# Patient Record
Sex: Female | Born: 1990 | Race: Black or African American | Hispanic: No | Marital: Single | Smoking: Current every day smoker
Health system: Southern US, Community
[De-identification: ages and names within clinical notes are randomized; demographics above are authoritative.]

## PROBLEM LIST (undated history)

## (undated) DIAGNOSIS — Z8719 Personal history of other diseases of the digestive system: Secondary | ICD-10-CM

## (undated) DIAGNOSIS — J45909 Unspecified asthma, uncomplicated: Secondary | ICD-10-CM

## (undated) DIAGNOSIS — F329 Major depressive disorder, single episode, unspecified: Secondary | ICD-10-CM

## (undated) DIAGNOSIS — F419 Anxiety disorder, unspecified: Secondary | ICD-10-CM

## (undated) DIAGNOSIS — K219 Gastro-esophageal reflux disease without esophagitis: Secondary | ICD-10-CM

## (undated) DIAGNOSIS — D649 Anemia, unspecified: Secondary | ICD-10-CM

## (undated) DIAGNOSIS — M722 Plantar fascial fibromatosis: Secondary | ICD-10-CM

## (undated) DIAGNOSIS — Z9889 Other specified postprocedural states: Secondary | ICD-10-CM

## (undated) DIAGNOSIS — F32A Depression, unspecified: Secondary | ICD-10-CM

## (undated) HISTORY — PX: WISDOM TOOTH EXTRACTION: SHX21

## (undated) HISTORY — PX: MOUTH SURGERY: SHX715

## (undated) HISTORY — PX: ADENOIDECTOMY: SHX5191

## (undated) HISTORY — PX: HERNIA REPAIR: SHX51

## (undated) HISTORY — PX: TYMPANOSTOMY TUBE PLACEMENT: SHX32

---
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
## 2010-08-15 ENCOUNTER — Emergency Department: Payer: Self-pay | Admitting: Emergency Medicine

## 2010-08-18 ENCOUNTER — Emergency Department: Payer: Self-pay | Admitting: Emergency Medicine

## 2010-08-20 ENCOUNTER — Emergency Department: Payer: Self-pay

## 2010-10-23 ENCOUNTER — Emergency Department: Payer: Self-pay | Admitting: Emergency Medicine

## 2010-10-26 ENCOUNTER — Inpatient Hospital Stay (HOSPITAL_COMMUNITY)
Admission: AD | Admit: 2010-10-26 | Discharge: 2010-10-26 | Payer: Self-pay | Source: Home / Self Care | Attending: Obstetrics and Gynecology | Admitting: Obstetrics and Gynecology

## 2010-10-26 LAB — WET PREP, GENITAL: Yeast Wet Prep HPF POC: NONE SEEN

## 2010-10-28 LAB — GC/CHLAMYDIA PROBE AMP, GENITAL: GC Probe Amp, Genital: NEGATIVE

## 2011-05-04 ENCOUNTER — Emergency Department: Payer: Self-pay | Admitting: Emergency Medicine

## 2011-05-30 ENCOUNTER — Emergency Department: Payer: Self-pay | Admitting: Internal Medicine

## 2011-07-28 ENCOUNTER — Emergency Department: Payer: Self-pay | Admitting: Unknown Physician Specialty

## 2011-09-04 ENCOUNTER — Emergency Department: Payer: Self-pay | Admitting: Emergency Medicine

## 2012-03-22 ENCOUNTER — Observation Stay: Payer: Self-pay

## 2012-03-31 ENCOUNTER — Observation Stay: Payer: Self-pay | Admitting: Obstetrics and Gynecology

## 2012-04-15 ENCOUNTER — Observation Stay: Payer: Self-pay

## 2012-04-16 ENCOUNTER — Observation Stay: Payer: Self-pay

## 2012-04-16 LAB — PIH PROFILE
Anion Gap: 10 (ref 7–16)
BUN: 11 mg/dL (ref 7–18)
Creatinine: 0.82 mg/dL (ref 0.60–1.30)
EGFR (African American): >60
EGFR (Non-African Amer.): >60
Glucose: 111 mg/dL — ABNORMAL HIGH (ref 65–99)
HGB: 11.6 g/dL — ABNORMAL LOW (ref 12.0–16.0)
MCH: 27.4 pg (ref 26.0–34.0)
MCHC: 31.3 g/dL — ABNORMAL LOW (ref 32.0–36.0)
MCV: 88 fL (ref 80–100)
Osmolality: 276 (ref 275–301)
Platelet: 263 10*3/uL (ref 150–440)
RBC: 4.22 10*6/uL (ref 3.80–5.20)
SGOT(AST): 24 U/L (ref 15–37)

## 2012-04-16 LAB — PROTEIN / CREATININE RATIO, URINE
Creatinine, Urine: 246.6 mg/dL — ABNORMAL HIGH (ref 30.0–125.0)
Protein, Random Urine: 57 mg/dL — ABNORMAL HIGH (ref 0–12)
Protein/Creat. Ratio: 231 mg/gCREAT — ABNORMAL HIGH (ref 0–200)

## 2012-04-21 ENCOUNTER — Inpatient Hospital Stay: Payer: Self-pay | Admitting: Obstetrics and Gynecology

## 2012-04-21 LAB — CBC WITH DIFFERENTIAL/PLATELET
Basophil #: 0 10*3/uL (ref 0.0–0.1)
Basophil %: 0.2 %
HCT: 37.9 % (ref 35.0–47.0)
HGB: 12 g/dL (ref 12.0–16.0)
Lymphocyte #: 2.4 10*3/uL (ref 1.0–3.6)
Lymphocyte %: 21.7 %
MCH: 27.7 pg (ref 26.0–34.0)
MCV: 87 fL (ref 80–100)
Monocyte #: 1.2 x10 3/mm — ABNORMAL HIGH (ref 0.2–0.9)
Monocyte %: 11 %
Neutrophil %: 66.6 %
Platelet: 267 10*3/uL (ref 150–440)
RDW: 14.5 % (ref 11.5–14.5)
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
## 2012-12-16 ENCOUNTER — Emergency Department: Payer: Self-pay | Admitting: Emergency Medicine

## 2013-01-25 ENCOUNTER — Inpatient Hospital Stay: Payer: Self-pay | Admitting: Psychiatry

## 2013-01-25 LAB — COMPREHENSIVE METABOLIC PANEL
Albumin: 4.1 g/dL (ref 3.4–5.0)
Alkaline Phosphatase: 80 U/L (ref 50–136)
Bilirubin,Total: 0.3 mg/dL (ref 0.2–1.0)
Chloride: 106 mmol/L (ref 98–107)
Creatinine: 0.75 mg/dL (ref 0.60–1.30)
EGFR (African American): >60
EGFR (Non-African Amer.): >60
Glucose: 96 mg/dL (ref 65–99)
Potassium: 3.2 mmol/L — ABNORMAL LOW (ref 3.5–5.1)
SGOT(AST): 33 U/L (ref 15–37)
Sodium: 138 mmol/L (ref 136–145)

## 2013-01-25 LAB — URINALYSIS, COMPLETE
Bilirubin,UR: NEGATIVE
Blood: NEGATIVE
Ketone: NEGATIVE
Leukocyte Esterase: NEGATIVE
Protein: NEGATIVE
Specific Gravity: 1.003 (ref 1.003–1.030)

## 2013-01-25 LAB — TSH: Thyroid Stimulating Horm: 1.16 u[IU]/mL

## 2013-01-25 LAB — ETHANOL
Ethanol %: 0.014 % (ref 0.000–0.080)
Ethanol: 14 mg/dL

## 2013-01-25 LAB — DRUG SCREEN, URINE
Amphetamines, Ur Screen: NEGATIVE (ref ?–1000)
Benzodiazepine, Ur Scrn: NEGATIVE (ref ?–200)
Cannabinoid 50 Ng, Ur ~~LOC~~: POSITIVE (ref ?–50)
Cocaine Metabolite,Ur ~~LOC~~: NEGATIVE (ref ?–300)
Methadone, Ur Screen: NEGATIVE (ref ?–300)
Tricyclic, Ur Screen: NEGATIVE (ref ?–1000)

## 2013-01-25 LAB — CBC
HCT: 37.5 % (ref 35.0–47.0)
MCHC: 32.7 g/dL (ref 32.0–36.0)
MCV: 85 fL (ref 80–100)
RDW: 16.3 % — ABNORMAL HIGH (ref 11.5–14.5)

## 2013-02-07 LAB — COMPREHENSIVE METABOLIC PANEL
Albumin: 4.4 g/dL (ref 3.4–5.0)
Alkaline Phosphatase: 77 U/L (ref 50–136)
Anion Gap: 9 (ref 7–16)
Bilirubin,Total: 0.2 mg/dL (ref 0.2–1.0)
Calcium, Total: 9.5 mg/dL (ref 8.5–10.1)
Chloride: 107 mmol/L (ref 98–107)
Co2: 23 mmol/L (ref 21–32)
Creatinine: 0.9 mg/dL (ref 0.60–1.30)
EGFR (African American): >60
EGFR (Non-African Amer.): >60
Osmolality: 280 (ref 275–301)
SGOT(AST): 27 U/L (ref 15–37)
SGPT (ALT): 21 U/L (ref 12–78)
Sodium: 139 mmol/L (ref 136–145)
Total Protein: 7.8 g/dL (ref 6.4–8.2)

## 2013-02-07 LAB — CBC
HCT: 36.9 % (ref 35.0–47.0)
MCH: 28.9 pg (ref 26.0–34.0)
MCV: 86 fL (ref 80–100)
Platelet: 315 10*3/uL (ref 150–440)
RBC: 4.29 10*6/uL (ref 3.80–5.20)
RDW: 15.9 % — ABNORMAL HIGH (ref 11.5–14.5)
WBC: 8.7 10*3/uL (ref 3.6–11.0)

## 2013-02-07 LAB — DRUG SCREEN, URINE
Barbiturates, Ur Screen: NEGATIVE (ref ?–200)
Benzodiazepine, Ur Scrn: NEGATIVE (ref ?–200)
Cocaine Metabolite,Ur ~~LOC~~: NEGATIVE (ref ?–300)
Methadone, Ur Screen: NEGATIVE (ref ?–300)
Opiate, Ur Screen: NEGATIVE (ref ?–300)
Phencyclidine (PCP) Ur S: NEGATIVE (ref ?–25)
Tricyclic, Ur Screen: NEGATIVE (ref ?–1000)

## 2013-02-07 LAB — URINALYSIS, COMPLETE
Bacteria: NONE SEEN
Bilirubin,UR: NEGATIVE
Glucose,UR: NEGATIVE mg/dL (ref 0–75)
Nitrite: NEGATIVE
Ph: 5 (ref 4.5–8.0)
Specific Gravity: 1.032 (ref 1.003–1.030)
Squamous Epithelial: 2
WBC UR: 2 /HPF (ref 0–5)

## 2013-02-07 LAB — ETHANOL
Ethanol %: <0.003 % (ref 0.000–0.080)
Ethanol: <3 mg/dL

## 2013-02-07 LAB — TSH: Thyroid Stimulating Horm: 1.43 u[IU]/mL

## 2013-02-08 ENCOUNTER — Inpatient Hospital Stay: Payer: Self-pay | Admitting: Psychiatry

## 2013-02-09 LAB — BEHAVIORAL MEDICINE 1 PANEL
Albumin: 4.2 g/dL (ref 3.4–5.0)
Alkaline Phosphatase: 76 U/L (ref 50–136)
Anion Gap: 6 — ABNORMAL LOW (ref 7–16)
BUN: 17 mg/dL (ref 7–18)
Basophil #: 0 10*3/uL (ref 0.0–0.1)
Basophil %: 0.3 %
Bilirubin,Total: 0.4 mg/dL (ref 0.2–1.0)
Calcium, Total: 9.2 mg/dL (ref 8.5–10.1)
Chloride: 107 mmol/L (ref 98–107)
Co2: 27 mmol/L (ref 21–32)
Creatinine: 0.83 mg/dL (ref 0.60–1.30)
EGFR (African American): >60
EGFR (Non-African Amer.): >60
Eosinophil #: 0.1 10*3/uL (ref 0.0–0.7)
Eosinophil %: 0.7 %
Glucose: 93 mg/dL (ref 65–99)
HCT: 41.4 % (ref 35.0–47.0)
HGB: 13.5 g/dL (ref 12.0–16.0)
Lymphocyte #: 3.6 10*3/uL (ref 1.0–3.6)
Lymphocyte %: 39.1 %
MCH: 28.4 pg (ref 26.0–34.0)
MCHC: 32.7 g/dL (ref 32.0–36.0)
MCV: 87 fL (ref 80–100)
Monocyte #: 0.9 x10 3/mm (ref 0.2–0.9)
Monocyte %: 10.1 %
Neutrophil #: 4.6 10*3/uL (ref 1.4–6.5)
Neutrophil %: 49.8 %
Osmolality: 281 (ref 275–301)
Platelet: 358 10*3/uL (ref 150–440)
Potassium: 3.8 mmol/L (ref 3.5–5.1)
RBC: 4.77 10*6/uL (ref 3.80–5.20)
RDW: 16.3 % — ABNORMAL HIGH (ref 11.5–14.5)
SGOT(AST): 31 U/L (ref 15–37)
SGPT (ALT): 21 U/L (ref 12–78)
Sodium: 140 mmol/L (ref 136–145)
Thyroid Stimulating Horm: 1.76 u[IU]/mL
Total Protein: 7.7 g/dL (ref 6.4–8.2)
WBC: 9.2 10*3/uL (ref 3.6–11.0)

## 2013-02-09 LAB — URINALYSIS, COMPLETE
Bacteria: NONE SEEN
Bilirubin,UR: NEGATIVE
Blood: NEGATIVE
Glucose,UR: NEGATIVE mg/dL (ref 0–75)
Ketone: NEGATIVE
Leukocyte Esterase: NEGATIVE
Nitrite: NEGATIVE
Ph: 6 (ref 4.5–8.0)
Protein: NEGATIVE
RBC,UR: <1 /HPF (ref 0–5)
Specific Gravity: 1.013 (ref 1.003–1.030)
Squamous Epithelial: <1
WBC UR: NONE SEEN /HPF (ref 0–5)

## 2013-03-08 ENCOUNTER — Emergency Department: Payer: Self-pay | Admitting: Internal Medicine

## 2013-03-08 LAB — DRUG SCREEN, URINE
Benzodiazepine, Ur Scrn: NEGATIVE (ref ?–200)
Cannabinoid 50 Ng, Ur ~~LOC~~: POSITIVE (ref ?–50)
MDMA (Ecstasy)Ur Screen: NEGATIVE (ref ?–500)
Methadone, Ur Screen: NEGATIVE (ref ?–300)
Opiate, Ur Screen: NEGATIVE (ref ?–300)
Phencyclidine (PCP) Ur S: NEGATIVE (ref ?–25)
Tricyclic, Ur Screen: POSITIVE (ref ?–1000)

## 2013-03-08 LAB — ETHANOL
Ethanol %: <0.003 % (ref 0.000–0.080)
Ethanol: <3 mg/dL

## 2013-03-08 LAB — COMPREHENSIVE METABOLIC PANEL
Albumin: 4.8 g/dL (ref 3.4–5.0)
Alkaline Phosphatase: 98 U/L (ref 50–136)
Anion Gap: 5 — ABNORMAL LOW (ref 7–16)
Calcium, Total: 9.3 mg/dL (ref 8.5–10.1)
Chloride: 106 mmol/L (ref 98–107)
Co2: 27 mmol/L (ref 21–32)
EGFR (Non-African Amer.): >60
Glucose: 95 mg/dL (ref 65–99)
Potassium: 4.4 mmol/L (ref 3.5–5.1)
SGPT (ALT): 22 U/L (ref 12–78)
Sodium: 138 mmol/L (ref 136–145)
Total Protein: 8.4 g/dL — ABNORMAL HIGH (ref 6.4–8.2)

## 2013-03-08 LAB — URINALYSIS, COMPLETE
Blood: NEGATIVE
Glucose,UR: NEGATIVE mg/dL (ref 0–75)
Ketone: NEGATIVE
Nitrite: NEGATIVE
Protein: NEGATIVE
Squamous Epithelial: NONE SEEN

## 2013-03-08 LAB — CBC
HGB: 13 g/dL (ref 12.0–16.0)
MCHC: 32.8 g/dL (ref 32.0–36.0)
MCV: 87 fL (ref 80–100)
Platelet: 349 10*3/uL (ref 150–440)
RBC: 4.58 10*6/uL (ref 3.80–5.20)

## 2013-03-08 LAB — TSH: Thyroid Stimulating Horm: 0.63 u[IU]/mL

## 2013-03-08 LAB — ACETAMINOPHEN LEVEL: Acetaminophen: <2 ug/mL

## 2013-03-09 ENCOUNTER — Encounter (HOSPITAL_COMMUNITY): Payer: Self-pay | Admitting: *Deleted

## 2013-03-09 NOTE — BH Assessment (Signed)
Assessment Note   Claire Taylor is an 22 y.o. female. Patient presented to [facility] complaining of "I need to be a better mom for my child." "I'm having thoughts of hurting my 74 month old baby." "I haven't been taking my meds properly, they don't work." Constantly repeating "I don't know." And just stares.  Patient is a marijuana abuser, first used in her teens spokes one joint two times a week last used March 07, 2013.  Patient appears disheveled, regressed, moderately uncooperative staring off into space, reports difficulty falling asleep, difficulty staying asleep, affect is flat, patient reports she's helpless "I don't know, I don't know", thought blocking , very slow in delivery of her speech.  Memory recent memory appears to be impaired  Denies hallucinations.  Patient is oriented person.  Patient reports she's been non compliant with medications.  Pt accepted for inpatient hospitalization by Jacqulyn Cane MD.  Axis I: Bipolar, Depressed and with psychosis Axis II: Deferred Axis III:  Past Medical History  Diagnosis Date  . Depression   . Anxiety   . H/O umbilical hernia repair    Axis IV: other psychosocial or environmental problems, problems related to social environment, problems with access to health care services and problems with primary support group Axis V: 21-30 behavior considerably influenced by delusions or hallucinations OR serious impairment in judgment, communication OR inability to function in almost all areas  Past Medical History:  Past Medical History  Diagnosis Date  . Depression   . Anxiety   . H/O umbilical hernia repair     Past Surgical History  Procedure Laterality Date  . Adenoidectomy      Family History: No family history on file.  Social History:  reports that she has been smoking.  She does not have any smokeless tobacco history on file. She reports that she uses illicit drugs (Marijuana). She reports that she does not drink  alcohol.  Additional Social History:  Alcohol / Drug Use Pain Medications: denies abuse Prescriptions: denies abuse Over the Counter: denies abuse History of alcohol / drug use?: Yes Substance #1 Name of Substance 1: cannibus 1 - Age of First Use: teens 1 - Amount (size/oz): 1 joint 1 - Frequency: 2 x week 1 - Duration: years 1 - Last Use / Amount: 03/07/2013  CIWA:   COWS:    Allergies:  Allergies  Allergen Reactions  . Banana     Itching on tongue  . Mylanta (Alum & Mag Hydroxide-Simeth)     Unspecified reaction to Mylanta fast acting  . Relpax (Eletriptan)     unspecified    Home Medications:  No prescriptions prior to admission    OB/GYN Status:  No LMP recorded. Patient is not currently having periods (Reason: IUD).  General Assessment Data Location of Assessment: [facility] Assessment Services Living Arrangements: Children;Parent Can pt return to current living arrangement?: Yes Admission Status: Involuntary Is patient capable of signing voluntary admission?: No Transfer from: Acute Hospital Referral Source: MD  Education Status Is patient currently in school?: No  Risk to self Suicidal Ideation: No Suicidal Intent: No Is patient at risk for suicide?: No Suicidal Plan?: No Access to Means: No What has been your use of drugs/alcohol within the last 12 months?: THC Previous Attempts/Gestures: No Intentional Self Injurious Behavior: None Family Suicide History: Unknown Recent stressful life event(s): Other (Comment) (unable to care for 65 month old) Persecutory voices/beliefs?: No Depression: Yes Depression Symptoms: Despondent;Guilt;Loss of interest in usual pleasures;Feeling angry/irritable Substance  abuse history and/or treatment for substance abuse?: Yes Suicide prevention information given to non-admitted patients: Not applicable  Risk to Others Homicidal Ideation: Yes-Currently Present Thoughts of Harm to Others: Yes-Currently Present Comment -  Thoughts of Harm to Others: Thoughts to harm 47 month old  Current Homicidal Intent: Yes-Currently Present Current Homicidal Plan: No Access to Homicidal Means: No Identified Victim: 30 month old History of harm to others?: No Assessment of Violence: None Noted Does patient have access to weapons?: No Criminal Charges Pending?: No Does patient have a court date: No  Psychosis Hallucinations: None noted Delusions: None noted  Mental Status Report Appear/Hygiene: Disheveled Eye Contact: Poor Motor Activity: Psychomotor retardation Speech: Tangential (resistant/guarded) Level of Consciousness: Irritable;Quiet/awake Mood: Depressed;Guilty;Helpless Affect: Depressed;Blunted;Irritable;Apathetic Anxiety Level: Minimal Thought Processes: Circumstantial Judgement: Impaired Orientation: Person Obsessive Compulsive Thoughts/Behaviors: Minimal  Cognitive Functioning Concentration: Decreased Memory: Recent Impaired;Remote Intact IQ: Average Insight: Poor Impulse Control: Poor Appetite: Fair Weight Loss: 0 Weight Gain: 0 Sleep: Decreased Total Hours of Sleep:  (pt unclear) Vegetative Symptoms: Decreased grooming  ADLScreening [facility] Assessment Services) Patient's cognitive ability adequate to safely complete daily activities?: Yes Patient able to express need for assistance with ADLs?: Yes Independently performs ADLs?: Yes (appropriate for developmental age)  Abuse/Neglect [facility]) Physical Abuse: Denies Verbal Abuse: Denies Sexual Abuse: Denies  Prior Inpatient Therapy Prior Inpatient Therapy: Yes Prior Therapy Dates: not specified Prior Therapy Facilty/Provider(s): [facility] Reason for Treatment: MDD   Prior Outpatient Therapy Prior Outpatient Therapy: Yes Prior Therapy Dates: not specified Prior Therapy Facilty/Provider(s): not specified Reason for Treatment: Depression with psychosis  ADL Screening (condition at time of admission) Patient's cognitive ability adequate to  safely complete daily activities?: Yes Patient able to express need for assistance with ADLs?: Yes Independently performs ADLs?: Yes (appropriate for developmental age) Weakness of Legs: None Weakness of Arms/Hands: None  Home Assistive Devices/Equipment Home Assistive Devices/Equipment: None    Abuse/Neglect Assessment (Assessment to be complete while patient is alone) Physical Abuse: Denies Verbal Abuse: Denies Sexual Abuse: Denies Exploitation of patient/patient's resources: Denies Self-Neglect: Denies     Merchant [employer] officer (For Healthcare) Advance Directive:  (unable to answer) Nutrition Screen- [facility] Adult/WL/AP Patient's home diet: Lactose free;Other (Comment) (Allergic to bananas cause itching tongue) Have you recently lost weight without trying?: Patient is unsure Have you been eating poorly because of a decreased appetite?: No Malnutrition Screening Tool Score: 2  Additional Information 1:1 In Past 12 Months?: Yes (currently in ED) CIRT Risk: Yes Elopement Risk: No Does patient have medical clearance?: Yes     Disposition:  Disposition Initial Assessment Completed for this Encounter: Yes Disposition of Patient: Inpatient treatment program Type of inpatient treatment program: Adult  On Site Evaluation by:   Reviewed with Physician:     Conan Bowens 03/09/2013 8:21 PM

## 2013-03-10 ENCOUNTER — Encounter (HOSPITAL_COMMUNITY): Payer: Self-pay | Admitting: *Deleted

## 2013-03-10 ENCOUNTER — Inpatient Hospital Stay (HOSPITAL_COMMUNITY)
Admission: AD | Admit: 2013-03-10 | Discharge: 2013-03-14 | DRG: 885 | Disposition: A | Discharge status: Home / Self Care | Payer: Medicaid Other | Source: Intra-hospital | Attending: Psychiatry | Admitting: Psychiatry

## 2013-03-10 DIAGNOSIS — Z79899 Other long term (current) drug therapy: Secondary | ICD-10-CM

## 2013-03-10 DIAGNOSIS — R109 Unspecified abdominal pain: Secondary | ICD-10-CM

## 2013-03-10 DIAGNOSIS — N83209 Unspecified ovarian cyst, unspecified side: Secondary | ICD-10-CM | POA: Diagnosis present

## 2013-03-10 DIAGNOSIS — F3163 Bipolar disorder, current episode mixed, severe, without psychotic features: Secondary | ICD-10-CM

## 2013-03-10 DIAGNOSIS — F1210 Cannabis abuse, uncomplicated: Secondary | ICD-10-CM | POA: Diagnosis present

## 2013-03-10 DIAGNOSIS — F332 Major depressive disorder, recurrent severe without psychotic features: Principal | ICD-10-CM | POA: Diagnosis present

## 2013-03-10 DIAGNOSIS — Z5900 Homelessness unspecified: Secondary | ICD-10-CM

## 2013-03-10 DIAGNOSIS — F411 Generalized anxiety disorder: Secondary | ICD-10-CM | POA: Diagnosis present

## 2013-03-10 DIAGNOSIS — F315 Bipolar disorder, current episode depressed, severe, with psychotic features: Secondary | ICD-10-CM

## 2013-03-10 HISTORY — DX: Other specified postprocedural states: Z98.890

## 2013-03-10 HISTORY — DX: Personal history of other diseases of the digestive system: Z87.19

## 2013-03-10 HISTORY — DX: Depression, unspecified: F32.A

## 2013-03-10 HISTORY — DX: Anxiety disorder, unspecified: F41.9

## 2013-03-10 HISTORY — DX: Major depressive disorder, single episode, unspecified: F32.9

## 2013-03-10 LAB — URINALYSIS, ROUTINE W REFLEX MICROSCOPIC
Glucose, UA: NEGATIVE mg/dL
Hgb urine dipstick: NEGATIVE
Leukocytes, UA: NEGATIVE
Specific Gravity, Urine: 1.002 — ABNORMAL LOW (ref 1.005–1.030)
Urobilinogen, UA: 0.2 mg/dL (ref 0.0–1.0)

## 2013-03-10 MED ORDER — OLANZAPINE 10 MG PO TABS
10.0000 mg | ORAL_TABLET | Freq: Every day | ORAL | Status: DC
  Administered 2013-03-10: 10 mg via ORAL
  Filled 2013-03-10 (×3): qty 1

## 2013-03-10 MED ORDER — NICOTINE 21 MG/24HR TD PT24
21.0000 mg | MEDICATED_PATCH | Freq: Every day | TRANSDERMAL | Status: DC
  Administered 2013-03-11 – 2013-03-14 (×3): 21 mg via TRANSDERMAL
  Filled 2013-03-10 (×7): qty 1

## 2013-03-10 MED ORDER — HYDROXYZINE HCL 25 MG PO TABS
25.0000 mg | ORAL_TABLET | Freq: Four times a day (QID) | ORAL | Status: DC | PRN
  Administered 2013-03-10 – 2013-03-13 (×4): 25 mg via ORAL

## 2013-03-10 MED ORDER — IBUPROFEN 200 MG PO TABS
400.0000 mg | ORAL_TABLET | Freq: Four times a day (QID) | ORAL | Status: DC | PRN
  Administered 2013-03-10 – 2013-03-14 (×5): 400 mg via ORAL
  Filled 2013-03-10 (×2): qty 2
  Filled 2013-03-10: qty 1
  Filled 2013-03-10 (×3): qty 2

## 2013-03-10 MED ORDER — DOCUSATE SODIUM 100 MG PO CAPS
100.0000 mg | ORAL_CAPSULE | Freq: Every day | ORAL | Status: DC
  Administered 2013-03-10 – 2013-03-13 (×4): 100 mg via ORAL
  Filled 2013-03-10 (×6): qty 1

## 2013-03-10 MED ORDER — QUETIAPINE FUMARATE 50 MG PO TABS
50.0000 mg | ORAL_TABLET | Freq: Three times a day (TID) | ORAL | Status: DC | PRN
  Administered 2013-03-10: 50 mg via ORAL
  Filled 2013-03-10: qty 1

## 2013-03-10 MED ORDER — FLUOXETINE HCL 10 MG PO CAPS
10.0000 mg | ORAL_CAPSULE | Freq: Every day | ORAL | Status: DC
  Administered 2013-03-10: 10 mg via ORAL
  Filled 2013-03-10 (×2): qty 1

## 2013-03-10 MED ORDER — TRAZODONE HCL 100 MG PO TABS
100.0000 mg | ORAL_TABLET | Freq: Every evening | ORAL | Status: DC | PRN
  Administered 2013-03-10: 100 mg via ORAL
  Filled 2013-03-10: qty 1

## 2013-03-10 MED ORDER — MAGNESIUM HYDROXIDE 400 MG/5ML PO SUSP: 30.0000 mL | Freq: Every day | ORAL | Status: DC | PRN

## 2013-03-10 MED ORDER — CHLORDIAZEPOXIDE HCL 25 MG PO CAPS
25.0000 mg | ORAL_CAPSULE | Freq: Four times a day (QID) | ORAL | Status: DC | PRN
  Administered 2013-03-10 (×2): 25 mg via ORAL
  Filled 2013-03-10 (×2): qty 1

## 2013-03-10 MED ORDER — METHOCARBAMOL 500 MG PO TABS
500.0000 mg | ORAL_TABLET | Freq: Three times a day (TID) | ORAL | Status: DC | PRN
  Administered 2013-03-10 – 2013-03-12 (×5): 500 mg via ORAL
  Filled 2013-03-10 (×4): qty 1

## 2013-03-10 MED ORDER — ALUM & MAG HYDROXIDE-SIMETH 200-200-20 MG/5ML PO SUSP: 30.0000 mL | ORAL | Status: DC | PRN

## 2013-03-10 MED ORDER — ACETAMINOPHEN 325 MG PO TABS
650.0000 mg | ORAL_TABLET | Freq: Four times a day (QID) | ORAL | Status: DC | PRN
  Administered 2013-03-10 – 2013-03-11 (×3): 650 mg via ORAL

## 2013-03-10 NOTE — Progress Notes (Signed)
D: Patient appropriate and cooperative with staff. Patient's affect/mood is anxious and slightly irritable. Patient complained of abdominal cramping 10/10. She reported on the self inventory sheet that she requested medication for sleep, appetite is good, energy level is low and ability to pay attention is improving. Patient rated depression and feelings of hopelessness "10".  A: Support and encouragement provided to patient. PRN Tylenol given for pain. Monitor Q15 minute checks for safety.  R: Patient receptive. Passive SI, but contracts for safety. Denies HI and AVH. Patient remains safe on the unit.
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
## 2013-03-10 NOTE — Progress Notes (Signed)
Patient ID: Claire Taylor, female   DOB: 03/13/1991, 22 y.o.   MRN: [id number] Pt. Is a 22 yo female admitted for harmful thought towards 66 month old child. Pt. Reports "overwhelmed with raising a child" Pt. Reports AVH, child's father died in 08/16/23 "still hear him and see him, have to remind myself he's dead" Pt. Reports she smokes THC 1-2 joints a day. Denies alcohol "it don't go good with my medicine".  Pt. Reports she is living between family members. Pt. Reports constant suicidal thoughts, but contracts for safety. Pt. Reports previous admission at [facility], but none at [facility]. Pt. Has six tattoos one left thigh and right hand that is new an still red. Pt. Oriented to unit/room, offered food/drink. Pt. Showered and went to bed, noted that she had already taken meds. Staff will monitor q8min for safety.

## 2013-03-10 NOTE — [facility] Suicide Risk Assessment (Signed)
Suicide Risk Assessment  Admission Assessment     Nursing information obtained from:  Patient Demographic factors:  Low socioeconomic status Current Mental Status:  Self-harm thoughts Loss Factors:  Loss of significant relationship;Legal issues;Financial problems / change in socioeconomic status Historical Factors:  Prior suicide attempts;Family history of suicide;Family history of mental illness or substance abuse Risk Reduction Factors:  Responsible for children under 25 years of age;Religious beliefs about death;Living with another person, especially a relative  CLINICAL FACTORS:   Severe Anxiety and/or Agitation Panic Attacks Depression:   Aggression Anhedonia Hopelessness Impulsivity Insomnia Recent sense of peace/wellbeing Severe Postpartum Depression Personality Disorders:   Cluster B More than one psychiatric diagnosis Unstable or Poor Therapeutic Relationship Previous Psychiatric Diagnoses and Treatments  COGNITIVE FEATURES THAT CONTRIBUTE TO RISK:  Closed-mindedness Loss of executive function Polarized thinking    SUICIDE RISK:   Moderate:  Frequent suicidal ideation with limited intensity, and duration, some specificity in terms of plans, no associated intent, good self-control, limited dysphoria/symptomatology, some risk factors present, and identifiable protective factors, including available and accessible social support.  PLAN OF CARE: Admitted involuntarily, emergently for depression, anxiety and suicidal and homicidal ideation towards her infant who is staying with her grandma/mom. Will asks case manager to contact family for safety concerns.  I certify that inpatient services furnished can reasonably be expected to improve the patient's condition.   Adeyemi Hamad,JANARDHAHA R. 03/10/2013, 3:22 PM

## 2013-03-10 NOTE — Tx Team (Addendum)
Initial Interdisciplinary Treatment Plan  PATIENT STRENGTHS: (choose at least two) Ability for insight Average or above average intelligence Communication skills General fund of knowledge Supportive family/friends  PATIENT STRESSORS: Financial difficulties Legal issue Medication change or noncompliance Substance abuse   PROBLEM LIST: Problem List/Patient Goals Date to be addressed Date deferred Reason deferred Estimated date of resolution  Depression 03-10-13     THC use 03-10-13     Suicidal thoughts 0612-14     Thoughts of hurting child 03-10-13     Legal issues 03-10-13                              DISCHARGE CRITERIA:  Adequate post-discharge living arrangements Improved stabilization in mood, thinking, and/or behavior Motivation to continue treatment in a less acute level of care Need for constant or close observation no longer present  PRELIMINARY DISCHARGE PLAN: Attend aftercare/continuing care group Outpatient therapy  PATIENT/FAMIILY INVOLVEMENT: This treatment plan has been presented to and reviewed with the patient, Claire Taylor, and/or family member.  The patient and family have been given the opportunity to ask questions and make suggestions.  Mickeal Needy 03/10/2013, 2:57 AM
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
## 2013-03-10 NOTE — Progress Notes (Signed)
Patient ID: Claire Taylor, female   DOB: Sep 21, 1991, 22 y.o.   MRN: [id number] D: Pt. Reports "today wasn't good was having abdominal pain, I got a cyst", reports depression at "8" of 10 and anxiety at "10". A: Writer noted that meds had been given and none available at this time, reviewed administration times. Writer encouraged client to attend group. Staff will monitor q57min for safety. R: Pt. Is safe on the unit, notes no pain at this time. Pt. Attended karaoke and participated.

## 2013-03-11 DIAGNOSIS — F410 Panic disorder [episodic paroxysmal anxiety] without agoraphobia: Secondary | ICD-10-CM

## 2013-03-11 DIAGNOSIS — F3160 Bipolar disorder, current episode mixed, unspecified: Secondary | ICD-10-CM

## 2013-03-11 MED ORDER — FLUOXETINE HCL 20 MG PO CAPS
20.0000 mg | ORAL_CAPSULE | Freq: Every day | ORAL | Status: DC
  Administered 2013-03-11 – 2013-03-12 (×2): 20 mg via ORAL
  Filled 2013-03-11 (×4): qty 1

## 2013-03-11 MED ORDER — QUETIAPINE FUMARATE 200 MG PO TABS
200.0000 mg | ORAL_TABLET | Freq: Every day | ORAL | Status: DC
  Administered 2013-03-11 – 2013-03-13 (×3): 200 mg via ORAL
  Filled 2013-03-11 (×5): qty 1
  Filled 2013-03-11: qty 2
  Filled 2013-03-11: qty 1

## 2013-03-11 MED ORDER — CLONAZEPAM 1 MG PO TABS
1.0000 mg | ORAL_TABLET | Freq: Two times a day (BID) | ORAL | Status: DC
  Administered 2013-03-11 – 2013-03-12 (×2): 1 mg via ORAL
  Filled 2013-03-11 (×2): qty 1

## 2013-03-11 MED ORDER — QUETIAPINE FUMARATE 100 MG PO TABS
100.0000 mg | ORAL_TABLET | Freq: Every day | ORAL | Status: DC
  Administered 2013-03-12: 100 mg via ORAL
  Filled 2013-03-11 (×3): qty 1

## 2013-03-11 NOTE — B[facility] Group Notes (Signed)
[facility] LCSW Aftercare Discharge Planning Group Note   03/11/2013 8:45 AM  Participation Quality:  Alert and Appropriate   Mood/Affect:  Appropriate, Flat and Depressed  Depression Rating:  8  Anxiety Rating:  10  Thoughts of Suicide:  Pt denies SI/HI  Will you contract for safety?   Yes  Current AVH:  No  Plan for Discharge/Comments:  Pt attended discharge planning group and actively participated in group.  CSW provided pt with today's workbook.  Pt states that she came here for depression and doesn't want to fail as a parent.  Pt states that she is basically homeless but stays with various people in [place], [place].  Pt will follow up at [facility] in [place], [place].  No further needs voiced by pt at this time.    Transportation Means: Pt requested the sheriff to transport her back home  Supports: No supports mentioned at this time  Reyes Ivan, LCSWA 03/11/2013 10:38 AM

## 2013-03-11 NOTE — Progress Notes (Signed)
D: Patient appropriate and cooperative with staff and peers. Patient's affect/mood is anxious. Patient complained of pain 10/10, anxiety and muscle spasms. She reported on the self inventory sheet that her sleep is poor, appetite is good, energy level is normal and ability to pay attention is improving. Patient rated depression and feelings of hopelessness "8". She's attending groups and interacting with peers on the unit.  A: Support and encouragement provided to patient. Administered scheduled medications per ordering MD. PRN Tylenol given for pain, Vistaril for anxiety and Robaxin for muscle spasms. Maintain Q15 minute checks for safety.  R: Patient receptive. Denies SI/HI/AVH. Patient remains safe.
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
## 2013-03-11 NOTE — Progress Notes (Signed)
NUTRITION ASSESSMENT  Pt identified as at risk on the Malnutrition Screen Tool  INTERVENTION: 1. Educated patient on the importance of nutrition and encouraged intake of food and beverages. 2. Discussed weight goals. 3.  Instructed on healthy eating for weight loss.  Teach back method used.  NUTRITION DIAGNOSIS: Unintentional weight gain related to depression AEB weight 20# above UBW.    Goal: Pt to meet >/= 90% of their estimated nutrition needs.  Monitor:  PO intake  Assessment:  Patient admitted with depression.  Reports that she is eating well.  UBW of 174#.  Patient stated that she recently gained weight.  Eats more with depression and medications to treat depression.  Reported that she has been working to try to lose weight.  Aunt has been helping patient make healthy choices.  States she grows a garden.  Current intake is good.  22 y.o. female  Height: Ht Readings from Last 1 Encounters:  03/10/13 5' 5.5" (1.664 m)    Weight: Wt Readings from Last 1 Encounters:  03/10/13 193 lb (87.544 kg)    Weight Hx: Wt Readings from Last 10 Encounters:  03/10/13 193 lb (87.544 kg)    BMI:  Body mass index is 31.62 kg/(m^2). Pt meets criteria for obesity grade 1 based on current BMI.  Estimated Nutritional Needs: Kcal: 25-30 kcal/kg Protein: > 1 gram protein/kg Fluid: 1 ml/kcal  Diet Order: General Pt is also offered choice of unit snacks mid-morning and mid-afternoon.  Pt is eating as desired.   Lab results and medications reviewed.   Oran Rein, RD, LDN Clinical Inpatient Dietitian Pager:  [phone] Weekend and after hours pager:  [phone]

## 2013-03-11 NOTE — Progress Notes (Signed)
Patient ID: Claire Taylor, female   DOB: Jan 31, 1991, 22 y.o.   MRN: [id number] D: Patient mood/afect is anxious. Pt stated her anxiety is from been away from her child who just started walking and she is missing that moment. Pt denies SI/HI/AVH and pain. Pt attended evening wrap up group and engaged in discussions. Pt denies any needs or concerns.  Cooperative with assessment. No acute distressed noted at this time.   A: Met with pt 1:1. Medications administered as prescribed. Writer encouraged pt to discuss feelings. Pt encouraged to come to staff with any question or concerns. 15 minutes checks for safety.  R: Patient remains safe. She is complaint with medications and denies any adverse reaction. Continue current POC.

## 2013-03-11 NOTE — Tx Team (Signed)
Interdisciplinary Treatment Plan Update (Adult)  Date: 03/11/2013  Time Reviewed:  9:45 AM  Progress in Treatment: Attending groups: Yes Participating in groups:  Yes Taking medication as prescribed:  Yes Tolerating medication:  Yes Family/Significant othe contact made: CSW assessing  Patient understands diagnosis:  Yes Discussing patient identified problems/goals with staff:  Yes Medical problems stabilized or resolved:  Yes Denies suicidal/homicidal ideation: Yes Issues/concerns per patient self-inventory:  Yes Other:  New problem(s) identified: N/A  Discharge Plan or Barriers: CSW assessing for appropriate referrals.  Reason for Continuation of Hospitalization: Anxiety Depression Medication Stabilization  Comments: N/A  Estimated length of stay: 3-5 days  For review of initial/current patient goals, please see plan of care.  Attendees: Patient:     Family:     Physician:  Dr. Javier Glazier 03/11/2013 10:25 AM   Nursing:   Quintella Reichert, RN 03/11/2013 10:25 AM   Clinical Social Worker:  Reyes Ivan, LCSWA 03/11/2013 10:25 AM   Other: Lowella Grip, RN 03/11/2013 10:25 AM   Other:  Frankey Shown, MA care coordination 03/11/2013 10:25 AM   Other:  Juline Patch, LCSW 03/11/2013 10:25 AM   Other:   03/11/2013 10:25 AM  Other:    Other:    Other:    Other:    Other:    Other:     Scribe for Treatment Team:   Carmina Miller, 03/11/2013 10:25 AM

## 2013-03-11 NOTE — Progress Notes (Signed)
Adult Psychoeducational Group Note  Date:  03/11/2013 Time:  1:18 PM  Group Topic/Focus:  Relapse Prevention Planning:   The focus of this group is to define relapse and discuss the need for planning to combat relapse.  Participation Level:  Active  Participation Quality:  Appropriate and Attentive  Affect:  Appropriate  Cognitive:  Alert and Appropriate  Insight: Good  Engagement in Group:  Engaged  Modes of Intervention:  Discussion, Socialization and Support  Additional Comments:  Claire Taylor participated well in the therapeutic games played today. The values and goals disscused were related to relapse prevention.  Claire Taylor 03/11/2013, 1:18 PM

## 2013-03-11 NOTE — Progress Notes (Signed)
Adult Psychoeducational Group Note  Date:  03/11/2013 Time:  12:27 PM  Group Topic/Focus:  Goals Group:   The focus of this group is to help patients establish daily goals to achieve during treatment and discuss how the patient can incorporate goal setting into their daily lives to aide in recovery.  Participation Level:  Minimal  Participation Quality:  Attentive  Affect:  Flat  Cognitive:  Appropriate  Insight: None  Engagement in Group:  Limited  Modes of Intervention:  Discussion, Socialization and Support  Additional Comments:  Claire Taylor was not present for most of the group due to meeting with the doctor and case worker. She expressed her stress level was very high due to pain. She was engaged when others were sharing.  Claire Taylor 03/11/2013, 12:27 PM

## 2013-03-11 NOTE — B[facility] Suicide Risk Assessment (Signed)
[facility] INPATIENT:  Family/Significant Other Suicide Prevention Education  Suicide Prevention Education:  Education Completed; Regino Schultze - aunt [phone]),  (name of family member/significant other) has been identified by the patient as the family member/significant other with whom the patient will be residing, and identified as the person(s) who will aid the patient in the event of a mental health crisis (suicidal ideations/suicide attempt).  With written consent from the patient, the family member/significant other has been provided the following suicide prevention education, prior to the and/or following the discharge of the patient.  The suicide prevention education provided includes the following:  Suicide risk factors  Suicide prevention and interventions  National Suicide Hotline telephone number  [facility] assessment telephone number  [facility] Emergency Assistance 911  [facility] and/or Residential Mobile Crisis Unit telephone number  Request made of family/significant other to:  Remove weapons (e.g., guns, rifles, knives), all items previously/currently identified as safety concern.    Remove drugs/medications (over-the-counter, prescriptions, illicit drugs), all items previously/currently identified as a safety concern.  The family member/significant other verbalizes understanding of the suicide prevention education information provided.  The family member/significant other agrees to remove the items of safety concern listed above.  Aunt states that pt will be staying with her upon d/c.    Carmina Miller 03/11/2013, 1:02 PM

## 2013-03-11 NOTE — Progress Notes (Signed)
[facility] MD Progress Note  03/11/2013 11:26 AM Claire Taylor  MRN:  [id number] Subjective: patient was seen for medication management given this morning. Patient reported she has been struggling with depression 8/10, mood swings and anxiety 8/10 in has self-harmthoughts. Patient has been focused on medications and seems to have a drug-seeking behavior. Patient requested to change her Zyprexa to Seroquel and asked to discontinue her trazodone which was not helpful for sleep. Patient also focused on her pain medication even though patient does not seem like suffering with any pain. Patient is able to participate in unit activities and groups. .. Diagnosis:  Axis I: Bipolar, mixed and Panic Disorder  ADL's:  Intact  Sleep: Poor  Appetite:  Poor  Suicidal Ideation:  Plan:  yes Intent:  yes Means:  yes Homicidal Ideation:  denied AEB (as evidenced by):  Psychiatric Specialty Exam: ROS  Blood pressure 136/92, pulse 120, temperature 98.2 F (36.8 C), temperature source Oral, resp. rate 16, height 5' 5.5" (1.664 m), weight 87.544 kg (193 lb).Body mass index is 31.62 kg/(m^2).  General Appearance: Casual and Fairly Groomed  Eye Contact::  Good  Speech:  Clear and Coherent and Pressured  Volume:  Increased  Mood:  Anxious, Depressed and Irritable  Affect:  Appropriate, Depressed and Labile  Thought Process:  Coherent, Goal Directed and Linear  Orientation:  Full (Time, Place, and Person)  Thought Content:  Obsessions and Rumination  Suicidal Thoughts:  Yes.  with intent/plan  Homicidal Thoughts:  No  Memory:  Immediate;   Good Recent;   Fair  Judgement:  Impaired  Insight:  Lacking  Psychomotor Activity:  Restlessness and Tremor  Concentration:  Fair  Recall:  Fair  Akathisia:  Negative  Handed:  Right  AIMS (if indicated):     Assets:  Communication Skills Desire for Improvement Physical Health  Sleep:  Number of Hours: 5.75   Current Medications: Current Facility-Administered  Medications  Medication Dose Route Frequency Provider Last Rate Last Dose  . acetaminophen (TYLENOL) tablet 650 mg  650 mg Oral Q6H PRN Larena Sox, MD   650 mg at 03/10/13 1854  . chlordiazePOXIDE (LIBRIUM) capsule 25 mg  25 mg Oral Q6H PRN Larena Sox, MD   25 mg at 03/10/13 2153  . docusate sodium (COLACE) capsule 100 mg  100 mg Oral QHS Fransisca Kaufmann, NP   100 mg at 03/10/13 2154  . FLUoxetine (PROZAC) capsule 20 mg  20 mg Oral QHS Nehemiah Settle, MD      . hydrOXYzine (ATARAX/VISTARIL) tablet 25 mg  25 mg Oral Q6H PRN Fransisca Kaufmann, NP   25 mg at 03/11/13 4098  . ibuprofen (ADVIL,MOTRIN) tablet 400 mg  400 mg Oral Q6H PRN Fransisca Kaufmann, NP   400 mg at 03/10/13 2200  . methocarbamol (ROBAXIN) tablet 500 mg  500 mg Oral Q8H PRN Fransisca Kaufmann, NP   500 mg at 03/11/13 1191  . nicotine (NICODERM CQ - dosed in mg/24 hours) patch 21 mg  21 mg Transdermal Q0600 Nehemiah Settle, MD   21 mg at 03/11/13 0659  . [START ON 03/12/2013] QUEtiapine (SEROQUEL) tablet 100 mg  100 mg Oral Daily Nehemiah Settle, MD      . QUEtiapine (SEROQUEL) tablet 200 mg  200 mg Oral QHS Nehemiah Settle, MD        Lab Results:  Results for orders placed during the hospital encounter of 03/10/13 (from the past 48 hour(s))  URINALYSIS, ROUTINE W REFLEX MICROSCOPIC  Status: Abnormal   Collection Time    03/10/13  5:37 PM      Result Value Range   Color, Urine YELLOW  YELLOW   APPearance CLEAR  CLEAR   Specific Gravity, Urine 1.002 (*) 1.005 - 1.030   pH 7.0  5.0 - 8.0   Glucose, UA NEGATIVE  NEGATIVE mg/dL   Hgb urine dipstick NEGATIVE  NEGATIVE   Bilirubin Urine NEGATIVE  NEGATIVE   Ketones, ur NEGATIVE  NEGATIVE mg/dL   Protein, ur NEGATIVE  NEGATIVE mg/dL   Urobilinogen, UA 0.2  0.0 - 1.0 mg/dL   Nitrite NEGATIVE  NEGATIVE   Leukocytes, UA NEGATIVE  NEGATIVE   Comment: MICROSCOPIC NOT DONE ON URINES WITH NEGATIVE PROTEIN, BLOOD, LEUKOCYTES, NITRITE, OR GLUCOSE <1000  mg/dL.    Physical Findings: AIMS: Facial and Oral Movements Muscles of Facial Expression: None, normal Lips and Perioral Area: None, normal Jaw: None, normal Tongue: None, normal,Extremity Movements Upper (arms, wrists, hands, fingers): None, normal Lower (legs, knees, ankles, toes): None, normal, Trunk Movements Neck, shoulders, hips: None, normal, Overall Severity Severity of abnormal movements (highest score from questions above): None, normal Incapacitation due to abnormal movements: None, normal Patient's awareness of abnormal movements (rate only patient's report): No Awareness, Dental Status Current problems with teeth and/or dentures?: No Does patient usually wear dentures?: No  CIWA:    COWS:     Treatment Plan Summary: Daily contact with patient to assess and evaluate symptoms and progress in treatment Medication management  Plan: 1. Discontinue Zyprexa - not helpful 2. Start Seroquel 100 mg qam and 200 mg Qhs 3. Discontinue Trazodone - not helpful 4. Increase Fluoxetine 20 mg PO Qd/depression 5. Start Klonopin 1 mg PO BID/ anxiety 6. Encourage active participation 7. Monitor of adverse effects 8. Disposition plan in progress.   Medical Decision Making Problem Points:  Established problem, worsening (2) and Review of psycho-social stressors (1) Data Points:  Review or order clinical lab tests (1) Review of medication regiment & side effects (2) Review of new medications or change in dosage (2)  I certify that inpatient services furnished can reasonably be expected to improve the patient's condition.   Claire Taylor,Claire R. 03/11/2013, 11:26 AM

## 2013-03-12 DIAGNOSIS — R109 Unspecified abdominal pain: Secondary | ICD-10-CM

## 2013-03-12 DIAGNOSIS — F3163 Bipolar disorder, current episode mixed, severe, without psychotic features: Secondary | ICD-10-CM

## 2013-03-12 DIAGNOSIS — F1020 Alcohol dependence, uncomplicated: Secondary | ICD-10-CM

## 2013-03-12 DIAGNOSIS — N83209 Unspecified ovarian cyst, unspecified side: Secondary | ICD-10-CM

## 2013-03-12 DIAGNOSIS — F1220 Cannabis dependence, uncomplicated: Secondary | ICD-10-CM

## 2013-03-12 LAB — CBC
HCT: 34.5 % — ABNORMAL LOW (ref 36.0–46.0)
MCHC: 32.8 g/dL (ref 30.0–36.0)
MCV: 84.6 fL (ref 78.0–100.0)
Platelets: 326 10*3/uL (ref 150–400)
RDW: 14.6 % (ref 11.5–15.5)
WBC: 12.7 10*3/uL — ABNORMAL HIGH (ref 4.0–10.5)

## 2013-03-12 MED ORDER — TRAMADOL HCL 50 MG PO TABS
50.0000 mg | ORAL_TABLET | Freq: Four times a day (QID) | ORAL | Status: DC | PRN
  Administered 2013-03-13 – 2013-03-14 (×4): 50 mg via ORAL
  Filled 2013-03-12 (×4): qty 1

## 2013-03-12 MED ORDER — CLONAZEPAM 0.5 MG PO TABS
0.5000 mg | ORAL_TABLET | Freq: Two times a day (BID) | ORAL | Status: DC | PRN
  Administered 2013-03-12 – 2013-03-13 (×2): 0.5 mg via ORAL
  Filled 2013-03-12 (×2): qty 1

## 2013-03-12 MED ORDER — QUETIAPINE FUMARATE 50 MG PO TABS
50.0000 mg | ORAL_TABLET | Freq: Two times a day (BID) | ORAL | Status: DC
  Administered 2013-03-12 – 2013-03-13 (×2): 50 mg via ORAL
  Filled 2013-03-12 (×7): qty 1

## 2013-03-12 MED ORDER — LORATADINE 10 MG PO TABS
10.0000 mg | ORAL_TABLET | Freq: Every day | ORAL | Status: DC
  Administered 2013-03-12 – 2013-03-14 (×3): 10 mg via ORAL
  Filled 2013-03-12 (×6): qty 1

## 2013-03-12 NOTE — Progress Notes (Signed)
Psychoeducational Group Note  Date: 03/12/2013 Time:  1015  Group Topic/Focus:  Identifying Needs:   The focus of this group is to help patients identify their personal needs that have been historically problematic and identify healthy behaviors to address their needs.  Participation Level:  Active  Participation Quality:  Appropriate  Affect:  Appropriate  Cognitive:  Appropriate  Insight:  Improving  Engagement in Group:  Improving  Additional Comments:  Pt participated in group.   Satomi Buda A  

## 2013-03-12 NOTE — Progress Notes (Addendum)
[facility] MD Progress Note  03/12/2013 3:05 PM Claire Taylor  MRN:  [id number] Subjective:  The patient reports that she is here to get her medications straightened out. She states that she was on olanzapine and seroquel at the same time though her psychiatrist wanted her only on seroquel. She is now on Seroquel daily, she states that she had been.  The patient reports that she was started on Klonipin 1 mg BID but also admits to substance abuse problems. She reports that she is having pain on her right side, and reports a history of cysts. She states that she felt that is popped and is requesting pain medication. She felt as if seroquel 100 mg in the morning was too high and felt drowsy this morning.  Diagnosis:  Axis I: Axis I: Bipolar, mixed and Panic Disorder, Alcohol Dependence. Marijuana Dependence  ADL's:  Intact  Sleep: Poor  Appetite:  Poor  Suicidal Ideation:  Plan:  Patient denies. Intent:  Patient denies. Means:  Patient denies. Homicidal Ideation:  Plan:  Patient denies. Intent:  Patient denies. Means:  Patient denies. AEB (as evidenced by): Behavior on unit.  Psychiatric Specialty Exam: Review of Systems  Constitutional: Negative for fever, chills and weight loss.  Respiratory: Negative for cough, hemoptysis, sputum production, shortness of breath and wheezing.   Cardiovascular: Negative for chest pain and palpitations.  Gastrointestinal: Negative for heartburn, nausea, vomiting, abdominal pain, diarrhea and constipation.  Neurological: Negative for dizziness, tingling, tremors, sensory change and speech change.    Blood pressure 126/56, pulse 114, temperature 97.3 F (36.3 C), temperature source Oral, resp. rate 18, height 5' 5.5" (1.664 m), weight 87.544 kg (193 lb).Body mass index is 31.62 kg/(m^2).  General Appearance: Casual and Disheveled  Eye Contact::  Fair  Speech:  Clear and Coherent and Normal Rate  Volume:  Normal  Mood:  Dysphoric  Affect:  Appropriate,  Congruent and Full Range  Thought Process:  Coherent, Linear and Logical  Orientation:  Full (Time, Place, and Person)  Thought Content:  WDL  Suicidal Thoughts:  No  Homicidal Thoughts:  No  Memory:  Immediate;   Fair Recent;   Good Remote;   Good  Judgement:  Poor  Insight:  Lacking  Psychomotor Activity:  Normal  Concentration:  Fair  Recall:  Fair  Akathisia:  Negative  Handed:  Left  AIMS (if indicated):   As noted in charted.  Assets:  Engineer, maintenance Social Support  Sleep:  Number of Hours: 6   Current Medications: Current Facility-Administered Medications  Medication Dose Route Frequency Provider Last Rate Last Dose  . acetaminophen (TYLENOL) tablet 650 mg  650 mg Oral Q6H PRN Larena Sox, MD   650 mg at 03/11/13 1136  . chlordiazePOXIDE (LIBRIUM) capsule 25 mg  25 mg Oral Q6H PRN Larena Sox, MD   25 mg at 03/10/13 2153  . clonazePAM (KLONOPIN) tablet 1 mg  1 mg Oral BID Nehemiah Settle, MD   1 mg at 03/12/13 0736  . docusate sodium (COLACE) capsule 100 mg  100 mg Oral QHS Fransisca Kaufmann, NP   100 mg at 03/11/13 2128  . FLUoxetine (PROZAC) capsule 20 mg  20 mg Oral QHS Nehemiah Settle, MD   20 mg at 03/11/13 2128  . hydrOXYzine (ATARAX/VISTARIL) tablet 25 mg  25 mg Oral Q6H PRN Fransisca Kaufmann, NP   25 mg at 03/11/13 1455  . ibuprofen (ADVIL,MOTRIN) tablet 400 mg  400 mg Oral Q6H PRN Fransisca Kaufmann,  NP   400 mg at 03/12/13 1315  . methocarbamol (ROBAXIN) tablet 500 mg  500 mg Oral Q8H PRN Fransisca Kaufmann, NP   500 mg at 03/12/13 0736  . nicotine (NICODERM CQ - dosed in mg/24 hours) patch 21 mg  21 mg Transdermal Q0600 Nehemiah Settle, MD   21 mg at 03/12/13 0634  . QUEtiapine (SEROQUEL) tablet 100 mg  100 mg Oral Daily Nehemiah Settle, MD   100 mg at 03/12/13 0736  . QUEtiapine (SEROQUEL) tablet 200 mg  200 mg Oral QHS Nehemiah Settle, MD   200 mg at 03/11/13 2128    Lab Results:  Results for orders placed  during the hospital encounter of 03/10/13 (from the past 48 hour(s))  URINALYSIS, ROUTINE W REFLEX MICROSCOPIC     Status: Abnormal   Collection Time    03/10/13  5:37 PM      Result Value Range   Color, Urine YELLOW  YELLOW   APPearance CLEAR  CLEAR   Specific Gravity, Urine 1.002 (*) 1.005 - 1.030   pH 7.0  5.0 - 8.0   Glucose, UA NEGATIVE  NEGATIVE mg/dL   Hgb urine dipstick NEGATIVE  NEGATIVE   Bilirubin Urine NEGATIVE  NEGATIVE   Ketones, ur NEGATIVE  NEGATIVE mg/dL   Protein, ur NEGATIVE  NEGATIVE mg/dL   Urobilinogen, UA 0.2  0.0 - 1.0 mg/dL   Nitrite NEGATIVE  NEGATIVE   Leukocytes, UA NEGATIVE  NEGATIVE   Comment: MICROSCOPIC NOT DONE ON URINES WITH NEGATIVE PROTEIN, BLOOD, LEUKOCYTES, NITRITE, OR GLUCOSE <1000 mg/dL.    Physical Findings: AIMS: Facial and Oral Movements Muscles of Facial Expression: None, normal Lips and Perioral Area: None, normal Jaw: None, normal Tongue: None, normal,Extremity Movements Upper (arms, wrists, hands, fingers): None, normal Lower (legs, knees, ankles, toes): None, normal, Trunk Movements Neck, shoulders, hips: None, normal, Overall Severity Severity of abnormal movements (highest score from questions above): None, normal Incapacitation due to abnormal movements: None, normal Patient's awareness of abnormal movements (rate only patient's report): No Awareness, Dental Status Current problems with teeth and/or dentures?: No Does patient usually wear dentures?: No  CIWA:    COWS:     Treatment Plan Summary: Daily contact with patient to assess and evaluate symptoms and progress in treatment Medication management  Plan: 1. Seroquel 50 mg BID and 200 mg Qhs  2. Fluoxetine 20 mg PO Qd/depression  3. Will change to Klonopin BID PRN and decrease dose to 0.5 mg-as she has substance abuse issues. Will discontinue chlordiazepoxide as she is on this medication as well. 4. Will start claritin for patient.  5. Encourage active  participation 6. Monitor of adverse effects  7. Will place medicine consult for right flank pain.  8.  Disposition plan in progress.    Medical Decision Making Problem Points:  Established problem, worsening (2), Review of last therapy session (1) and Review of psycho-social stressors (1) Data Points:  Order Aims Assessment (2) Review or order clinical lab tests (1) Review of medication regiment & side effects (2)  I certify that inpatient services furnished can reasonably be expected to improve the patient's condition.   Dlisa Barnwell 03/12/2013, 3:05 PM

## 2013-03-12 NOTE — Progress Notes (Signed)
 .  Psychoeducational Group Note    Date: 03/12/2013 Time:  0915   Goal Setting Purpose of Group: To be able to set a goal that is measurable and that can be accomplished in one day Participation Level:  Did not attend [facility]keeper

## 2013-03-12 NOTE — Progress Notes (Signed)
D.  Pt. Denies SI/HI and denies A/V hallucinations.  Pt. Has been in bed sleeping.  Reports abdominal and flank pain. A.  Pt. Encouraged to attend group.  Internal consult ordered by physician .R.  Pt. Attended recreational therapy.  Pt. Seen by Internal MD (see Internal Consult note.)

## 2013-03-12 NOTE — Consult Note (Signed)
[facility] Medical Consultation  Claire Taylor WUJ:[id number] DOB: [birth year] DOA: 03/10/2013 PCP: No primary provider on file.   Requesting physician: psych Date of consultation: 03/12/13 Reason for consultation: abd pain/cysts  Impression/Recommendations Active Problems:   Other and unspecified ovarian cyst   Abdominal  pain, other specified site    1. abd pain- patient has IUD that was checked recently with a U/S and found to be in place, she was found to have an ovarian cyst and was supposed to follow with GYn on Friday- check serum pregnancy test- r/o tubal pregnancy; check U/A, check GC/chlamydia 2. Ovarian cyst- needs to follow up with GYN at d/c- consider a vaginal U/S here tomm if not better with ultram (request record from [facility]) 3. Substance abuse- avoid narcotics  I will followup again tomorrow. Please contact me if I can be of assistance in the meanwhile. Thank you for this consultation.  Chief Complaint: abd pain  HPI:  21 yo female who was admitted with substance abuse and thoughts of hurting her child.  She has known ovarian cysts and had an appointment with GYN on Friday but missed as she was here.  She has pain in her abd.  Comes and goes but has not gotten relief with tylenol/iburofen.  Does admit to some burning with urination.  No CP, no SOB, no vaginal bleeding.  + recent sex contact but says it was protected.  Has an IUD and was evaluated at [facility] recently with an U/S- IUD in place but had a cyst.  (will request records)    Review of Systems:  All systems reviewed, negative unless stated above  Past Medical History  Diagnosis Date  . Depression   . Anxiety   . H/O umbilical hernia repair    Past Surgical History  Procedure Laterality Date  . Adenoidectomy     Social History:  reports that she has been smoking.  She does not have any smokeless tobacco history on file. She reports that she uses illicit drugs (Marijuana). She  reports that she does not drink alcohol.  Allergies  Allergen Reactions  . Banana Other (See Comments)    "Makes my tongue and mouth itch"  . Mylanta (Alum & Mag Hydroxide-Simeth) Other (See Comments)    "It does the exact opposite of what it is supposed to do"  . Relpax (Eletriptan) Other (See Comments)    "My jaw locks up"   History reviewed. No pertinent family history.  Prior to Admission medications   Medication Sig Start Date End Date Taking? Authorizing Provider  clonazePAM (KLONOPIN) 0.5 MG tablet Take 0.5 mg by mouth 2 (two) times daily.   Yes Historical Provider, MD  docusate sodium (COLACE) 100 MG capsule Take 100 mg by mouth at bedtime.   Yes Historical Provider, MD  FLUoxetine (PROZAC) 10 MG capsule Take 10 mg by mouth at bedtime.   Yes Historical Provider, MD  loratadine (CLARITIN) 10 MG tablet Take 10 mg by mouth at bedtime.   Yes Historical Provider, MD  naproxen (NAPROSYN) 500 MG tablet Take 500 mg by mouth 2 (two) times daily with a meal.   Yes Historical Provider, MD  OLANZapine (ZYPREXA) 10 MG tablet Take 10 mg by mouth 3 (three) times daily.   Yes Historical Provider, MD  orphenadrine (NORFLEX) 100 MG tablet Take 100 mg by mouth 2 (two) times daily.   Yes Historical Provider, MD  oxyCODONE-acetaminophen (PERCOCET/ROXICET) 5-325 MG per tablet Take 1 tablet by mouth every 4 (four)  hours as needed for pain.   Yes Historical Provider, MD  QUEtiapine (SEROQUEL) 25 MG tablet Take 25 mg by mouth 3 (three) times daily.   Yes Historical Provider, MD  QUEtiapine (SEROQUEL) 300 MG tablet Take 600 mg by mouth at bedtime.   Yes Historical Provider, MD  tiZANidine (ZANAFLEX) 2 MG tablet Take 2 mg by mouth 4 (four) times daily.   Yes Historical Provider, MD   Physical Exam: Blood pressure 126/56, pulse 114, temperature 97.3 F (36.3 C), temperature source Oral, resp. rate 18, height 5' 5.5" (1.664 m), weight 87.544 kg (193 lb). Filed Vitals:   03/11/13 0745 03/11/13 0747 03/12/13  0600 03/12/13 0601  BP: 128/78 136/92 130/71 126/56  Pulse: 118 120 101 114  Temp: 98.2 F (36.8 C)  97.3 F (36.3 C)   TempSrc: Oral  Oral   Resp: 16  18   Height:      Weight:         General:  A+Ox3, NAD- up walking around  Eyes: wnl  ENT: wnl  Neck: supple  Cardiovascular: mildly tachy  Respiratory: clear anterior  Abdomen: +BS, mildly tender  Skin: no rashes or lesions, + tatoos  Musculoskeletal: moves all 4 ext  Psychiatric: good eye contact  Neurologic: no focal deficit  Labs on Admission:  Basic Metabolic Panel: No results found for this basename: NA, K, CL, CO2, GLUCOSE, BUN, CREATININE, CALCIUM, MG, PHOS,  in the last 168 hours Liver Function Tests: No results found for this basename: AST, ALT, ALKPHOS, BILITOT, PROT, ALBUMIN,  in the last 168 hours No results found for this basename: LIPASE, AMYLASE,  in the last 168 hours No results found for this basename: AMMONIA,  in the last 168 hours CBC: No results found for this basename: WBC, NEUTROABS, HGB, HCT, MCV, PLT,  in the last 168 hours Cardiac Enzymes: No results found for this basename: CKTOTAL, CKMB, CKMBINDEX, TROPONINI,  in the last 168 hours BNP: No components found with this basename: POCBNP,  CBG: No results found for this basename: GLUCAP,  in the last 168 hours  Radiological Exams on Admission: No results found.    Time spent: 27  Marlin Canary [facility] Pager [phone]  If 7PM-7AM, please contact night-coverage [email or web address] Password [facility] 03/12/2013, 4:22 PM

## 2013-03-12 NOTE — Progress Notes (Signed)
The focus of this group is to help patients review their daily goal of treatment and discuss progress on daily workbooks. Pt attended the evening group session and responded to all discussion prompts from the Writer. Pt reported having a good day, the highlight of which was being able to see her infant child during visitation just prior to group. Pt also reported having enjoyed her time outside in the courtyard today. Pt's affect was bright and she appeared engaged in the group.

## 2013-03-12 NOTE — [facility] Group Notes (Signed)
[facility] LCSW Group Therapy  Stages of Change 03/12/2013  1:38 PM    Type of Therapy:  Group Therapy  Participation Level:  Minimal Participation Quality:  Appropriate  Affect:  Appropriate  Cognitive:  Appropriate  Insight:  Developing/Improving  Engagement in Therapy:  Developing/Improving  Modes of Intervention:  Discussion, Education, Exploration, Problem-Solving, Rapport Building, Support  Summary of Progress/Problems: The topic for today was the stages of changes.  Patient were asked to identify changes they need to make and their motivation and commitment to making changes in their lives.  Patient advised of not feeling well and did not participate but remained in group.    Wynn Banker 03/12/2013  1:38 PM

## 2013-03-13 LAB — URINALYSIS, ROUTINE W REFLEX MICROSCOPIC
Glucose, UA: NEGATIVE mg/dL
Leukocytes, UA: NEGATIVE
Nitrite: NEGATIVE
Protein, ur: NEGATIVE mg/dL
pH: 7 (ref 5.0–8.0)

## 2013-03-13 MED ORDER — FLUOXETINE HCL 10 MG PO CAPS
30.0000 mg | ORAL_CAPSULE | Freq: Every day | ORAL | Status: DC
  Administered 2013-03-13: 30 mg via ORAL
  Filled 2013-03-13 (×4): qty 1

## 2013-03-13 MED ORDER — HYDROCORTISONE 1 % EX LOTN
TOPICAL_LOTION | Freq: Four times a day (QID) | CUTANEOUS | Status: DC | PRN
  Administered 2013-03-13: 23:00:00 via TOPICAL
  Filled 2013-03-13 (×2): qty 118

## 2013-03-13 MED ORDER — QUETIAPINE FUMARATE 50 MG PO TABS
50.0000 mg | ORAL_TABLET | Freq: Three times a day (TID) | ORAL | Status: DC
  Administered 2013-03-14 (×2): 50 mg via ORAL
  Filled 2013-03-13: qty 1
  Filled 2013-03-13: qty 6
  Filled 2013-03-13: qty 1
  Filled 2013-03-13: qty 6
  Filled 2013-03-13: qty 1
  Filled 2013-03-13: qty 6
  Filled 2013-03-13 (×4): qty 1

## 2013-03-13 MED ORDER — DIPHENHYDRAMINE HCL 25 MG PO CAPS
25.0000 mg | ORAL_CAPSULE | ORAL | Status: DC | PRN
  Administered 2013-03-13: 25 mg via ORAL

## 2013-03-13 MED ORDER — CLONAZEPAM 0.5 MG PO TABS
0.2500 mg | ORAL_TABLET | Freq: Two times a day (BID) | ORAL | Status: DC | PRN
  Filled 2013-03-13: qty 1

## 2013-03-13 NOTE — [facility] Group Notes (Signed)
[facility] LCSW Group Therapy  03/13/2013  10:00  Type of Therapy:  Group Therapy  Participation Level:  Did not attend    Summary of Progress/Problems:   The main focus of today's process group was to identify the patient's current support system and decide on other supports that can be put in place. Four definitions/levels of support were discussed and an exercise was utilized to show how much stronger we become with additional supports. An emphasis was placed on using counselor, doctor, therapy groups, 12-step groups, and problem-specific support groups to expand supports, as well as doing something different than has been done before.     [place], LCSW 03/13/2013 3:03 PM

## 2013-03-13 NOTE — Progress Notes (Signed)
Called [facility] in regards to getting pt pelvic ultrasound. Wonda Olds referred me to [phone] who was doing on-call for both Wonda Olds and [employer]. We will be contacted in the morning to set up the ultrasound for the patient.

## 2013-03-13 NOTE — Progress Notes (Signed)
Psychoeducational Group Note  Psychoeducational Group Note  Date: 03/13/2013 Time:  03/13/2013  Group Topic/Focus:  Gratefulness:  The focus of this group is to help patients identify what two things they are most grateful for in their lives. What helps ground them and to center them on their work to their recovery.  Participation Level:  Active  Participation Quality:  Appropriate  Affect:  Appropriate  Cognitive:  Appropriate  Insight:  Lacking  Engagement in Group:  Engaged  Additional Comments:   Claire Taylor A  
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
## 2013-03-13 NOTE — Progress Notes (Signed)
[facility] PROGRESS NOTE  Claire Taylor ZOX:[id number] DOB: 10-04-1990 DOA: 03/10/2013 PCP: No primary provider on file.  Assessment/Plan:  1-Right side abdominal pain: She has had this pain for last 3 months.  this could be related to ovarian cyst. Pregnancy test negative. She relates improvement of pain. Will order pelvic US. She will need to follow up with her GYN soon. UA negative. Chlamydia and GC pending. Continue with tramadol.   2-Ovarian Cyst: See problem number one. [place] to evaluate cyst.     Procedures:  none  Antibiotics:  none  HPI/Subjective: She has had this pain for last 3 moth, a little worse since she has been in [facility]. She feels is better today compare to yesterday.  She relates some vaginal clear discharge and brownish discharge.   Objective: Filed Vitals:   03/11/13 0745 03/11/13 0747 03/12/13 0600 03/12/13 0601  BP: 128/78 136/92 130/71 126/56  Pulse: 118 120 101 114  Temp: 98.2 F (36.8 C)  97.3 F (36.3 C)   TempSrc: Oral  Oral   Resp: 16  18   Height:      Weight:       No intake or output data in the 24 hours ending 03/13/13 1219 Filed Weights   03/10/13 0130  Weight: 87.544 kg (193 lb)    Exam:   General: no distress.   Cardiovascular: S 1, S 2 RRR  Respiratory: CTA  Abdomen: BS present, soft, mild right lower quadrant tenderness.   Musculoskeletal: no edema.   Data Reviewed: Basic Metabolic Panel: No results found for this basename: NA, K, CL, CO2, GLUCOSE, BUN, CREATININE, CALCIUM, MG, PHOS,  in the last 168 hours Liver Function Tests: No results found for this basename: AST, ALT, ALKPHOS, BILITOT, PROT, ALBUMIN,  in the last 168 hours No results found for this basename: LIPASE, AMYLASE,  in the last 168 hours No results found for this basename: AMMONIA,  in the last 168 hours CBC:  Recent Labs Lab 03/12/13 1933  WBC 12.7*  HGB 11.3*  HCT 34.5*  MCV 84.6  PLT 326   Cardiac Enzymes: No results found for this  basename: CKTOTAL, CKMB, CKMBINDEX, TROPONINI,  in the last 168 hours BNP (last 3 results) No results found for this basename: PROBNP,  in the last 8760 hours CBG: No results found for this basename: GLUCAP,  in the last 168 hours  No results found for this or any previous visit (from the past 240 hour(s)).   Studies: No results found.  Scheduled Meds: . docusate sodium  100 mg Oral QHS  . FLUoxetine  20 mg Oral QHS  . loratadine  10 mg Oral Daily  . nicotine  21 mg Transdermal Q0600  . QUEtiapine  200 mg Oral QHS  . QUEtiapine  50 mg Oral BID   Continuous Infusions:   Active Problems:   Other and unspecified ovarian cyst   Abdominal  pain, other specified site    Time spent: 25 minutes.     Claire Taylor  [facility] Pager [phone]. If 7PM-7AM, please contact night-coverage at [email or web address], password [facility] 03/13/2013, 12:19 PM  LOS: 3 days

## 2013-03-13 NOTE — Progress Notes (Signed)
D. Pt has been up and active throughout the day today, attending and participating in various milieu activities. Pt expressed today that she needed to learn to work on her anger and also that she feels paranoid sometimes and that people are out to get her. Pt spoke about fighting as the first thing she does when she gets mad but acknowledged that she needs to learn a different method. Pt has received all medications today without incident. A. Support and encouragement provided, medication education completed. R. Pt verbalized understanding, will continue to monitor.

## 2013-03-13 NOTE — Progress Notes (Signed)
Psychoeducational Group Note  Date:  03/13/2013 Time:  1015  Group Topic/Focus:  Making Healthy Choices:   The focus of this group is to help patients identify negative/unhealthy choices they were using prior to admission and identify positive/healthier coping strategies to replace them upon discharge.  Participation Level:  Active  Participation Quality:  Appropriate  Affect:  Depressed  Cognitive:  Alert  Insight:  Improving  Engagement in Group:  Improving  Additional Comments:    Eleen Litz A 03/13/2013 

## 2013-03-14 DIAGNOSIS — F1910 Other psychoactive substance abuse, uncomplicated: Secondary | ICD-10-CM

## 2013-03-14 DIAGNOSIS — F332 Major depressive disorder, recurrent severe without psychotic features: Principal | ICD-10-CM

## 2013-03-14 LAB — GC/CHLAMYDIA PROBE AMP: CT Probe RNA: NEGATIVE

## 2013-03-14 MED ORDER — ORPHENADRINE CITRATE ER 100 MG PO TB12: 100.0000 mg | ORAL_TABLET | Freq: Two times a day (BID) | ORAL | Status: DC

## 2013-03-14 MED ORDER — FLUOXETINE HCL 10 MG PO CAPS: 30.0000 mg | ORAL_CAPSULE | Freq: Every day | ORAL | Status: DC

## 2013-03-14 MED ORDER — OXYCODONE-ACETAMINOPHEN 5-325 MG PO TABS: 1.0000 | ORAL_TABLET | ORAL | Status: DC | PRN

## 2013-03-14 MED ORDER — QUETIAPINE FUMARATE 200 MG PO TABS: 200.0000 mg | ORAL_TABLET | Freq: Every day | ORAL | Status: DC

## 2013-03-14 MED ORDER — CLONAZEPAM 0.5 MG PO TABS: 0.5000 mg | ORAL_TABLET | Freq: Two times a day (BID) | ORAL | Status: DC

## 2013-03-14 MED ORDER — LORATADINE 10 MG PO TABS: 10.0000 mg | ORAL_TABLET | Freq: Every day | ORAL | Status: DC

## 2013-03-14 MED ORDER — NAPROXEN 500 MG PO TABS: 500.0000 mg | ORAL_TABLET | Freq: Two times a day (BID) | ORAL | Status: DC

## 2013-03-14 MED ORDER — TIZANIDINE HCL 2 MG PO TABS: 2.0000 mg | ORAL_TABLET | Freq: Four times a day (QID) | ORAL | Status: DC

## 2013-03-14 MED ORDER — QUETIAPINE FUMARATE 50 MG PO TABS: 50.0000 mg | ORAL_TABLET | Freq: Three times a day (TID) | ORAL | Status: DC

## 2013-03-14 MED ORDER — FLUOXETINE HCL 10 MG PO CAPS
30.0000 mg | ORAL_CAPSULE | Freq: Every day | ORAL | Status: DC
  Filled 2013-03-14 (×2): qty 6

## 2013-03-14 MED ORDER — DOCUSATE SODIUM 100 MG PO CAPS: 100.0000 mg | ORAL_CAPSULE | Freq: Every day | ORAL | Status: DC

## 2013-03-14 NOTE — Progress Notes (Signed)
Adult Psychoeducational Group Note  Date:  03/14/2013 Time:  3:42 AM  Group Topic/Focus:  Wrap-Up Group:   The focus of this group is to help patients review their daily goal of treatment and discuss progress on daily workbooks.  Participation Level:  Minimal  Participation Quality:  Appropriate  Affect:  Anxious  Cognitive:  Appropriate  Insight: Appropriate and Limited  Engagement in Group:  Distracting  Modes of Intervention:  Support  Additional Comments:  Patient attended and participated in group tonight. During the beginning of group the patient was observed holding a female patient hand. Later this evening that same female patient was found in the patient's room. When confronted the patient respond was that "she did not speak english". In group today the patient reports that she slept half the day, she went outside and attended her groups.   Lita Mains [facility] 03/14/2013, 3:42 AM

## 2013-03-14 NOTE — Progress Notes (Signed)
Patient ID: Claire Taylor, female   DOB: 1991/07/17, 22 y.o.   MRN: [id number] Patient was discharged ambulatory to ride home with her Aunt with whom she will be living.  She denies SI/HI.  She denies any thoughts of harming her child.  She verbalizes understanding of her discharge meds and her follow up with [facility]. She was given scripts and a supply of meds as noted in chart.

## 2013-03-14 NOTE — [facility] Suicide Risk Assessment (Signed)
Suicide Risk Assessment  Discharge Assessment     Demographic Factors:  Adolescent or young adult, Low socioeconomic status and Unemployed  Mental Status Per Nursing Assessment::   On Admission:  Self-harm thoughts  Current Mental Status by Physician: Patient is calm and cooperative. she has good mood and bright affect. she has normal speech and thought process. she has denied suicidal/homicidal ideations, intention and plans. she has no evidence of psychosis.  Loss Factors: Financial problems/change in socioeconomic status  Historical Factors: Impulsivity  Risk Reduction Factors:   Responsible for children under 30 years of age, Sense of responsibility to family, Religious beliefs about death, Living with another person, especially a relative, Positive social support, Positive therapeutic relationship and Positive coping skills or problem solving skills  Continued Clinical Symptoms:  Depression:   Impulsivity Recent sense of peace/wellbeing Severe Previous Psychiatric Diagnoses and Treatments  Cognitive Features That Contribute To Risk:  Polarized thinking    Suicide Risk:  Minimal: No identifiable suicidal ideation.  Patients presenting with no risk factors but with morbid ruminations; may be classified as minimal risk based on the severity of the depressive symptoms  Discharge Diagnoses:   AXIS I:  Major Depression, Recurrent severe and Substance Abuse AXIS II:  Deferred AXIS III:   Past Medical History  Diagnosis Date  . Depression   . Anxiety   . H/O umbilical hernia repair    AXIS IV:  economic problems, housing problems, occupational problems, other psychosocial or environmental problems, problems related to social environment and problems with primary support group AXIS V:  61-70 mild symptoms  Plan Of Care/Follow-up recommendations:  Activity:  as tolerated Diet:  regular  Is patient on multiple antipsychotic therapies at discharge:  No   Has Patient had  three or more failed trials of antipsychotic monotherapy by history:  No  Recommended Plan for Multiple Antipsychotic Therapies: Not applicable  Desman Polak,JANARDHAHA R. 03/14/2013, 12:25 PM

## 2013-03-14 NOTE — Tx Team (Signed)
Interdisciplinary Treatment Plan Update (Adult)  Date: 03/11/2013  Time Reviewed:  9:45 AM  Progress in Treatment: Attending groups: Yes Participating in groups:  Yes Taking medication as prescribed:  Yes Tolerating medication:  Yes Family/Significant othe contact made:Yes, contact made with aunt. Discussing patient identified problems/goals with staff:  Yes Medical problems stabilized or resolved:  Yes Denies suicidal/homicidal ideation: Yes Issues/concerns per patient self-inventory:  Yes Other:  New problem(s) identified: N/A  Discharge Plan or Barriers:  Patient to discharge to aunt's home and follow up with [facility]  Reason for Continuation of Hospitalization:  Comments: Patint reports doing well and ready to discharge home today.  Estimated length of stay: discharge today.  For review of initial/current patient goals, please see plan of care.  Attendees: Patient:  Claire Taylor 03/14/2013  10:25 AM  Family:     Physician:  Dr. Javier Glazier 03/11/2013 10:25 AM   Nursing:   Quintella Reichert, RN 03/11/2013 10:25 AM   Clinical Social Worker:  Reyes Ivan, LCSWA 03/11/2013 10:25 AM   Other: Lowella Grip, RN 03/11/2013 10:25 AM   Other:  Frankey Shown, MA care coordination 03/11/2013 10:25 AM   Other:  Juline Patch, LCSW 03/11/2013 10:25 AM   Other:   03/11/2013 10:25 AM  Other:    Other:    Other:    Other:    Other:    Other:     Scribe for Treatment Team:   .Jerzey Komperda, LCSW 03/14/2013

## 2013-03-14 NOTE — Progress Notes (Signed)
Adult Psychoeducational Group Note  Date:  03/14/2013 Time:  3:14 PM  Group Topic/Focus:  Self Care:   The focus of this group is to help patients understand the importance of self-care in order to improve or restore emotional, physical, spiritual, interpersonal, and financial health.  Participation Level:  Minimal  Participation Quality:  Appropriate and Redirectable  Affect:  Appropriate  Cognitive:  Appropriate  Insight: Improving  Engagement in Group:  Developing/Improving  Modes of Intervention:  Activity, Discussion, Education, Exploration, Socialization and Support  Additional Comments: Claire Taylor attended group and shared during group, when asked. Patient was given a self care assessment and completed form and gave incite at times during group of what the weakness and strengths of physical, psychological, spiritual, emotional, relationship care, based on how the patient rated the areas of assessment. Patient concluded with making a goal on how to improve in the areas that need attention.   Claire Taylor 03/14/2013, 3:14 PM

## 2013-03-14 NOTE — Progress Notes (Signed)
[facility] Adult Case Management Discharge Plan :  Will you be returning to the same living situation after discharge: Yes,  Patient to discharge to live with aunt. At discharge, do you have transportation home?:Yes,  Patient to arrange transportation. Do you have the ability to pay for your medications:Yes,  Patient has Medicaid.  Release of information consent forms completed and in the chart;  Patient's signature needed at discharge.  Patient to Follow up at: Follow-up Information   Follow up with [facility] On 03/17/2013. (Appointment scheduled at 2:00 pm with Dr. Otis Dials for medication management.  Resume community support team which is already in place)    Contact information:   [address] [place], [place] [address] Phone: [phone] Fax: [phone]      Patient denies SI/HI:   .Patient no longer endorsing SI/HI or other thoughts of self harm.  Safety Planning and Suicide Prevention discussed: .Reviewed with all patients during discharge planning group. Wynn Banker 03/14/2013, 11:08 AM

## 2013-03-14 NOTE — Discharge Summary (Signed)
Physician Discharge Summary Note  Patient:  Claire Taylor is an 22 y.o., female MRN:  [id number] DOB:  06/22/1991 Patient phone:  [phone] (home)  Patient address:   [address]  [address] [place] [address],   Date of Admission:  03/10/2013 Date of Discharge: 03/14/13  Reason for Admission:  Depression, HI, THC abuse  Discharge Diagnoses: Active Problems:   Other and unspecified ovarian cyst   Abdominal  pain, other specified site  Review of Systems  Constitutional: Negative.   HENT: Negative.   Eyes: Negative.   Respiratory: Negative.   Cardiovascular: Negative.   Gastrointestinal: Negative.   Genitourinary: Negative.   Musculoskeletal: Negative.   Skin: Negative.   Neurological: Negative.   Endo/Heme/Allergies: Negative.   Psychiatric/Behavioral: Positive for substance abuse. Negative for depression, suicidal ideas, hallucinations and memory loss. The patient is not nervous/anxious and does not have insomnia.    Axis Diagnosis:   AXIS I:  Major Depression, Recurrent severe and Substance Abuse AXIS II:  Deferred AXIS III:   Past Medical History  Diagnosis Date  . Depression   . Anxiety   . H/O umbilical hernia repair    AXIS IV:  economic problems, housing problems, occupational problems, other psychosocial or environmental problems, problems related to social environment and problems with primary support group AXIS V:  61-70 mild symptoms  Level of Care:  OP  Hospital Course:  [name] is an 22 y.o. female. Patient presented to [facility] complaining of "I need to be a better mom for my child." "I'm having thoughts of hurting my 76 month old baby." "I haven't been taking my meds properly, they don't work." Constantly repeating "I don't know." And just stares. Patient is a marijuana abuser, first used in her teens spokes one joint two times a week last used March 07, 2013. Patient appears disheveled, regressed, moderately  uncooperative staring off into space, reports difficulty falling asleep, difficulty staying asleep, affect is flat, patient reports she's helpless "I don't know, I don't know", thought blocking , very slow in delivery of her speech. Memory recent memory appears to be impaired Denies hallucinations. Patient is oriented person. Patient reports she's been non compliant with medications.       The duration of stay was four days. The patient was seen and evaluated by the Treatment team consisting of Psychiatrist, NP-C, RN, Case Manager, and Therapist for evaluation and treatment plan with goal of stabilization upon discharge. The patient's physical and mental health problems were identified and treated appropriately. Rewa initially complained of many somatic complaints including vaginal pain. The patient complained of severe pain but was seen functioning on the unit without issues. A urinalysis ruled out the presence of a UTI. Patient had a medical consult to further evaluate her complaints of right sided abdominal pain. The patient complained of pain from a cyst and requested that her opiates be reordered. She was provided with ultram 50 mg every six hours as needed during her admission. She gradually verbalized less medically related complaints as her medications were adjusted.       Multiple modalities of treatment were used including medication, individual and group therapies, unit programming, improved nutrition, physical activity, and family sessions as needed. Patient's Prozac was increased to 30 mg to target symptoms of depression. On admission patient had been taking Zyprexa but reported it did not help her symptoms. However, patient also reported having lost the medication due to being homeless. Patient was started on Seroquel 50  mg TID and 200 mg at hs. The patient had reported symptoms of paranoia, panic attacks and mood instability. Her klonopin 0.5 mg bid prn was continued for symptoms of acute  anxiety. Patient's thought processes became much clearer with no evidence of thought blocking or slow delivery of speech as was present upon admission.      The symptoms of mood instability were monitored daily by evaluation by clinical provider.  The patient's mental and emotional status was evaluated by a daily self inventory completed by the patient. Improvement was demonstrated by declining numbers on the self assessment, improving vital signs, increased cognition, and improvement in mood, sleep, appetite as well as a reduction in physical symptoms. Patient reported having learned new coping skills to help work through anger and feelings of paranoia. In the past the patient reported that her first instinct was to fight but realizes now that this is very unhealthy.    The patient was evaluated and found to be stable enough for discharge and was released to home per the initial plan of treatment. Patient denied any thoughts to hurt her child all throughout her admission. Rated depression at zero and denied any SI to the treatment team on her day of discharge. Patient planned to stay with her Aunt after discharge. Patient agreed to complete her follow up appointments and remain compliant with her medications.   Mental Status Exam:  For mental status exam please see mental status exam and  suicide risk assessment completed by attending physician prior to discharge.  Consults:  Internal Medicine for abdominal pain  Significant Diagnostic Studies:  labs: Admission labwork reviewed  Discharge Vitals:   Blood pressure 122/78, pulse 98, temperature 98.4 F (36.9 C), temperature source Oral, resp. rate 20, height 5' 5.5" (1.664 m), weight 87.544 kg (193 lb). Body mass index is 31.62 kg/(m^2). Lab Results:   Results for orders placed during the hospital encounter of 03/10/13 (from the past 72 hour(s))  URINALYSIS, ROUTINE W REFLEX MICROSCOPIC     Status: Abnormal   Collection Time    03/12/13  5:25 PM       Result Value Range   Color, Urine YELLOW  YELLOW   APPearance CLEAR  CLEAR   Specific Gravity, Urine 1.031 (*) 1.005 - 1.030   pH 7.0  5.0 - 8.0   Glucose, UA NEGATIVE  NEGATIVE mg/dL   Hgb urine dipstick NEGATIVE  NEGATIVE   Bilirubin Urine NEGATIVE  NEGATIVE   Ketones, ur NEGATIVE  NEGATIVE mg/dL   Protein, ur NEGATIVE  NEGATIVE mg/dL   Urobilinogen, UA 0.2  0.0 - 1.0 mg/dL   Nitrite NEGATIVE  NEGATIVE   Leukocytes, UA NEGATIVE  NEGATIVE   Comment: MICROSCOPIC NOT DONE ON URINES WITH NEGATIVE PROTEIN, BLOOD, LEUKOCYTES, NITRITE, OR GLUCOSE <1000 mg/dL.  HCG, QUANTITATIVE, PREGNANCY     Status: None   Collection Time    03/12/13  7:33 PM      Result Value Range   hCG, Beta Chain, Quant, S <1  <5 mIU/mL   Comment:              GEST. AGE      CONC.  (mIU/mL)       <=1 WEEK        5 - 50         2 WEEKS       50 - 500         3 WEEKS       100 - 10,000  4 WEEKS     1,000 - 30,000         5 WEEKS     3,500 - 115,000       6-8 WEEKS     12,000 - 270,000        12 WEEKS     15,000 - 220,000                FEMALE AND NON-PREGNANT FEMALE:         LESS THAN 5 mIU/mL  CBC     Status: Abnormal   Collection Time    03/12/13  7:33 PM      Result Value Range   WBC 12.7 (*) 4.0 - 10.5 K/uL   RBC 4.08  3.87 - 5.11 MIL/uL   Hemoglobin 11.3 (*) 12.0 - 15.0 g/dL   HCT 09.8 (*) 11.9 - 14.7 %   MCV 84.6  78.0 - 100.0 fL   MCH 27.7  26.0 - 34.0 pg   MCHC 32.8  30.0 - 36.0 g/dL   RDW 82.9  56.2 - 13.0 %   Platelets 326  150 - 400 K/uL    Physical Findings: AIMS: Facial and Oral Movements Muscles of Facial Expression: None, normal Lips and Perioral Area: None, normal Jaw: None, normal Tongue: None, normal,Extremity Movements Upper (arms, wrists, hands, fingers): None, normal Lower (legs, knees, ankles, toes): None, normal, Trunk Movements Neck, shoulders, hips: None, normal, Overall Severity Severity of abnormal movements (highest score from questions above): None,  normal Incapacitation due to abnormal movements: None, normal Patient's awareness of abnormal movements (rate only patient's report): No Awareness, Dental Status Current problems with teeth and/or dentures?: No Does patient usually wear dentures?: No  CIWA:    COWS:     Psychiatric Specialty Exam: See Psychiatric Specialty Exam and Suicide Risk Assessment completed by Attending Physician prior to discharge.  Discharge destination:  Home  Is patient on multiple antipsychotic therapies at discharge:  No   Has Patient had three or more failed trials of antipsychotic monotherapy by history:  No  Recommended Plan for Multiple Antipsychotic Therapies: N/A     Medication List    STOP taking these medications       OLANZapine 10 MG tablet  Commonly known as:  ZYPREXA      TAKE these medications     Indication   clonazePAM 0.5 MG tablet  Commonly known as:  KLONOPIN  Take 1 tablet (0.5 mg total) by mouth 2 (two) times daily.   Indication:  Panic Disorder     docusate sodium 100 MG capsule  Commonly known as:  COLACE  Take 1 capsule (100 mg total) by mouth at bedtime.   Indication:  Constipation     FLUoxetine 10 MG capsule  Commonly known as:  PROZAC  Take 3 capsules (30 mg total) by mouth at bedtime.   Indication:  Manic-Depression, Depression     loratadine 10 MG tablet  Commonly known as:  CLARITIN  Take 1 tablet (10 mg total) by mouth at bedtime.   Indication:  Hayfever     naproxen 500 MG tablet  Commonly known as:  NAPROSYN  Take 1 tablet (500 mg total) by mouth 2 (two) times daily with a meal.   Indication:  Mild to Moderate Pain     orphenadrine 100 MG tablet  Commonly known as:  NORFLEX  Take 1 tablet (100 mg total) by mouth 2 (two) times daily.   Indication:  Muscle Spasm  oxyCODONE-acetaminophen 5-325 MG per tablet  Commonly known as:  PERCOCET/ROXICET  Take 1 tablet by mouth every 4 (four) hours as needed for pain.   Indication:  Pain      QUEtiapine 200 MG tablet  Commonly known as:  SEROQUEL  Take 1 tablet (200 mg total) by mouth at bedtime. For mood control.   Indication:  Depressive Phase of Manic-Depression, Trouble Sleeping     QUEtiapine 50 MG tablet  Commonly known as:  SEROQUEL  Take 1 tablet (50 mg total) by mouth 3 (three) times daily. For mood control.   Indication:  Depressive Phase of Manic-Depression     tiZANidine 2 MG tablet  Commonly known as:  ZANAFLEX  Take 1 tablet (2 mg total) by mouth 4 (four) times daily.   Indication:  Muscle Spasticity           Follow-up Information   Follow up with [facility] On 03/17/2013. (Appointment scheduled at 2:00 pm with Dr. Otis Dials for medication management.  Resume community support team which is already in place)    Contact information:   [address] [place], [place] [address] Phone: [phone] Fax: [phone]      Follow-up recommendations:  Activity:  Resume usual activities Diet:  Regular  Comments:   Take all your medications as prescribed by your mental healthcare provider.  Report any adverse effects and or reactions from your medicines to your outpatient provider promptly.  Patient is instructed and cautioned to not engage in alcohol and or illegal drug use while on prescription medicines.  In the event of worsening symptoms, patient is instructed to call the crisis hotline, 911 and or go to the nearest ED for appropriate evaluation and treatment of symptoms.  Follow-up with your primary care provider for your other medical issues, concerns and or health care needs.   Total Discharge Time:  Greater than 30 minutes. SignedFransisca Kaufmann NP-C 03/14/2013, 11:48 AM  Patient was personally examined and case discussed with the physician extender and developed treatment plan. Reviewed the information documented and agree with the discharge treatment plan.  Wai Litt,JANARDHAHA R. 03/15/2013 6:48 PM

## 2013-03-14 NOTE — [facility] Group Notes (Signed)
[facility] LCSW Aftercare Discharge Planning Group Note   03/14/2013 12:12 PM  Participation Quality:  Appropriate  Mood/Affect:  Appropriate  Depression Rating:  0  Anxiety Rating:  0  Thoughts of Suicide:  No  Will you contract for safety?   NA  Current AVH:  No  Plan for Discharge/Comments:  Patient reports doing well and ready for discharge today.  She advised of having transportation.  Transportation Means: Patient has transportation.   Supports:  Patient has a good support system.   Samhita Kretsch, Joesph July

## 2013-03-16 NOTE — Progress Notes (Signed)
Patient Discharge Instructions:  After Visit Summary (AVS):   Faxed to:  03/16/13 Discharge Summary Note:   Faxed to:  03/16/13 Psychiatric Admission Assessment Note:   Faxed to:  03/16/13 Suicide Risk Assessment - Discharge Assessment:   Faxed to:  03/16/13 Faxed/Sent to the Next Level Care provider:  03/16/13 Faxed to [facility] @ [phone]  Jerelene Redden, 03/16/2013, 2:47 PM

## 2014-02-08 ENCOUNTER — Encounter (HOSPITAL_COMMUNITY): Payer: Self-pay

## 2014-02-08 ENCOUNTER — Emergency Department: Payer: Self-pay | Admitting: Emergency Medicine

## 2014-02-08 ENCOUNTER — Inpatient Hospital Stay (HOSPITAL_COMMUNITY)
Admission: AD | Admit: 2014-02-08 | Discharge: 2014-02-13 | DRG: 885 | Disposition: A | Discharge status: Home / Self Care | Payer: Medicaid Other | Source: Intra-hospital | Attending: Psychiatry | Admitting: Psychiatry

## 2014-02-08 DIAGNOSIS — N760 Acute vaginitis: Secondary | ICD-10-CM

## 2014-02-08 DIAGNOSIS — R45851 Suicidal ideations: Secondary | ICD-10-CM

## 2014-02-08 DIAGNOSIS — F32A Depression, unspecified: Secondary | ICD-10-CM

## 2014-02-08 DIAGNOSIS — F1210 Cannabis abuse, uncomplicated: Secondary | ICD-10-CM

## 2014-02-08 DIAGNOSIS — N83209 Unspecified ovarian cyst, unspecified side: Secondary | ICD-10-CM

## 2014-02-08 DIAGNOSIS — F3162 Bipolar disorder, current episode mixed, moderate: Principal | ICD-10-CM

## 2014-02-08 DIAGNOSIS — F3164 Bipolar disorder, current episode mixed, severe, with psychotic features: Secondary | ICD-10-CM

## 2014-02-08 DIAGNOSIS — R109 Unspecified abdominal pain: Secondary | ICD-10-CM

## 2014-02-08 DIAGNOSIS — F3130 Bipolar disorder, current episode depressed, mild or moderate severity, unspecified: Secondary | ICD-10-CM

## 2014-02-08 DIAGNOSIS — F17200 Nicotine dependence, unspecified, uncomplicated: Secondary | ICD-10-CM | POA: Diagnosis present

## 2014-02-08 DIAGNOSIS — B9689 Other specified bacterial agents as the cause of diseases classified elsewhere: Secondary | ICD-10-CM

## 2014-02-08 DIAGNOSIS — F411 Generalized anxiety disorder: Secondary | ICD-10-CM | POA: Diagnosis present

## 2014-02-08 DIAGNOSIS — F329 Major depressive disorder, single episode, unspecified: Secondary | ICD-10-CM | POA: Diagnosis present

## 2014-02-08 DIAGNOSIS — F332 Major depressive disorder, recurrent severe without psychotic features: Secondary | ICD-10-CM

## 2014-02-08 LAB — URINALYSIS, COMPLETE
BILIRUBIN, UR: NEGATIVE
Blood: NEGATIVE
GLUCOSE, UR: NEGATIVE mg/dL (ref 0–75)
Ketone: NEGATIVE
LEUKOCYTE ESTERASE: NEGATIVE
NITRITE: NEGATIVE
PH: 6 (ref 4.5–8.0)
PROTEIN: NEGATIVE
RBC,UR: NONE SEEN /HPF (ref 0–5)
SPECIFIC GRAVITY: 1.001 (ref 1.003–1.030)
Squamous Epithelial: <1

## 2014-02-08 LAB — COMPREHENSIVE METABOLIC PANEL
ALBUMIN: 3.8 g/dL (ref 3.4–5.0)
ALK PHOS: 91 U/L
ANION GAP: 6 — AB (ref 7–16)
BILIRUBIN TOTAL: 0.3 mg/dL (ref 0.2–1.0)
BUN: 6 mg/dL — ABNORMAL LOW (ref 7–18)
CREATININE: 0.94 mg/dL (ref 0.60–1.30)
Calcium, Total: 8.9 mg/dL (ref 8.5–10.1)
Chloride: 106 mmol/L (ref 98–107)
Co2: 25 mmol/L (ref 21–32)
EGFR (Non-African Amer.): >60
GLUCOSE: 126 mg/dL — AB (ref 65–99)
Osmolality: 273 (ref 275–301)
Potassium: 3.2 mmol/L — ABNORMAL LOW (ref 3.5–5.1)
SGOT(AST): 46 U/L — ABNORMAL HIGH (ref 15–37)
SGPT (ALT): 27 U/L (ref 12–78)
SODIUM: 137 mmol/L (ref 136–145)
Total Protein: 8.1 g/dL (ref 6.4–8.2)

## 2014-02-08 LAB — CBC
HCT: 39.6 % (ref 35.0–47.0)
HGB: 12.7 g/dL (ref 12.0–16.0)
MCH: 27.9 pg (ref 26.0–34.0)
MCHC: 32 g/dL (ref 32.0–36.0)
MCV: 87 fL (ref 80–100)
PLATELETS: 359 10*3/uL (ref 150–440)
RBC: 4.55 10*6/uL (ref 3.80–5.20)
RDW: 13.9 % (ref 11.5–14.5)
WBC: 15.8 10*3/uL — ABNORMAL HIGH (ref 3.6–11.0)

## 2014-02-08 LAB — DRUG SCREEN, URINE
AMPHETAMINES, UR SCREEN: POSITIVE (ref ?–1000)
BARBITURATES, UR SCREEN: NEGATIVE (ref ?–200)
Benzodiazepine, Ur Scrn: NEGATIVE (ref ?–200)
Cannabinoid 50 Ng, Ur ~~LOC~~: POSITIVE (ref ?–50)
Cocaine Metabolite,Ur ~~LOC~~: NEGATIVE (ref ?–300)
MDMA (Ecstasy)Ur Screen: NEGATIVE (ref ?–500)
METHADONE, UR SCREEN: NEGATIVE (ref ?–300)
OPIATE, UR SCREEN: NEGATIVE (ref ?–300)
Phencyclidine (PCP) Ur S: NEGATIVE (ref ?–25)
Tricyclic, Ur Screen: NEGATIVE (ref ?–1000)

## 2014-02-08 LAB — ETHANOL: Ethanol: <3 mg/dL

## 2014-02-08 LAB — PREGNANCY, URINE: PREGNANCY TEST, URINE: NEGATIVE m[IU]/mL

## 2014-02-08 LAB — ACETAMINOPHEN LEVEL: Acetaminophen: <2 ug/mL

## 2014-02-08 LAB — SALICYLATE LEVEL
SALICYLATES, SERUM: 4.4 mg/dL — AB
Salicylates, Serum: 4.5 mg/dL — ABNORMAL HIGH

## 2014-02-08 MED ORDER — NICOTINE 21 MG/24HR TD PT24
21.0000 mg | MEDICATED_PATCH | Freq: Every day | TRANSDERMAL | Status: DC
  Administered 2014-02-08 – 2014-02-13 (×6): 21 mg via TRANSDERMAL
  Filled 2014-02-08 (×9): qty 1

## 2014-02-08 MED ORDER — ACETAMINOPHEN 325 MG PO TABS: 650.0000 mg | ORAL_TABLET | Freq: Four times a day (QID) | ORAL | Status: DC | PRN

## 2014-02-08 MED ORDER — TRAZODONE HCL 50 MG PO TABS
50.0000 mg | ORAL_TABLET | Freq: Every evening | ORAL | Status: DC | PRN
  Administered 2014-02-08: 50 mg via ORAL
  Filled 2014-02-08 (×4): qty 1

## 2014-02-08 MED ORDER — IBUPROFEN 800 MG PO TABS
800.0000 mg | ORAL_TABLET | Freq: Four times a day (QID) | ORAL | Status: DC | PRN
  Administered 2014-02-08 – 2014-02-13 (×7): 800 mg via ORAL
  Filled 2014-02-08 (×7): qty 1

## 2014-02-08 NOTE — Tx Team (Signed)
Initial Interdisciplinary Treatment Plan  PATIENT STRENGTHS: (choose at least two) Ability for insight Active sense of humor Motivation for treatment/growth Supportive family/friends  PATIENT STRESSORS: Health problems Legal issue Medication change or noncompliance   PROBLEM LIST: Problem List/Patient Goals Date to be addressed Date deferred Reason deferred Estimated date of resolution  Suicidal attempt 02/08/2014     depression 02/08/2014     anxiety 02/08/2014     A/VH 02/08/2014     Financial stress  02/08/2014                              DISCHARGE CRITERIA:  Ability to meet basic life and health needs Improved stabilization in mood, thinking, and/or behavior Motivation to continue treatment in a less acute level of care Need for constant or close observation no longer present  PRELIMINARY DISCHARGE PLAN: Participate in family therapy Return to previous living arrangement  PATIENT/FAMIILY INVOLVEMENT: This treatment plan has been presented to and reviewed with the patient, Claire Taylor, and/or family member, The patient and family have been given the opportunity to ask questions and make suggestions.  Salley ScarletLinda K Jehu-Appiah 02/08/2014, 10:02 PM
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
## 2014-02-09 DIAGNOSIS — N760 Acute vaginitis: Secondary | ICD-10-CM

## 2014-02-09 DIAGNOSIS — F319 Bipolar disorder, unspecified: Secondary | ICD-10-CM

## 2014-02-09 DIAGNOSIS — B9689 Other specified bacterial agents as the cause of diseases classified elsewhere: Secondary | ICD-10-CM | POA: Diagnosis present

## 2014-02-09 DIAGNOSIS — F329 Major depressive disorder, single episode, unspecified: Secondary | ICD-10-CM | POA: Diagnosis present

## 2014-02-09 DIAGNOSIS — F32A Depression, unspecified: Secondary | ICD-10-CM | POA: Diagnosis present

## 2014-02-09 DIAGNOSIS — F1210 Cannabis abuse, uncomplicated: Secondary | ICD-10-CM | POA: Diagnosis present

## 2014-02-09 DIAGNOSIS — F3162 Bipolar disorder, current episode mixed, moderate: Secondary | ICD-10-CM | POA: Diagnosis present

## 2014-02-09 DIAGNOSIS — F3164 Bipolar disorder, current episode mixed, severe, with psychotic features: Secondary | ICD-10-CM | POA: Diagnosis present

## 2014-02-09 MED ORDER — QUETIAPINE FUMARATE 200 MG PO TABS
200.0000 mg | ORAL_TABLET | Freq: Every day | ORAL | Status: DC
  Administered 2014-02-09: 200 mg via ORAL
  Filled 2014-02-09 (×3): qty 1

## 2014-02-09 MED ORDER — TRAZODONE HCL 50 MG PO TABS
50.0000 mg | ORAL_TABLET | Freq: Every day | ORAL | Status: DC
  Administered 2014-02-09: 50 mg via ORAL
  Filled 2014-02-09 (×3): qty 1

## 2014-02-09 MED ORDER — METRONIDAZOLE 0.75 % VA GEL
1.0000 | Freq: Two times a day (BID) | VAGINAL | Status: DC
  Administered 2014-02-09 – 2014-02-11 (×5): 1 via VAGINAL
  Filled 2014-02-09: qty 70

## 2014-02-09 MED ORDER — DOCUSATE SODIUM 100 MG PO CAPS
100.0000 mg | ORAL_CAPSULE | Freq: Every day | ORAL | Status: DC
  Administered 2014-02-09 – 2014-02-12 (×4): 100 mg via ORAL
  Filled 2014-02-09 (×7): qty 1

## 2014-02-09 MED ORDER — QUETIAPINE FUMARATE 25 MG PO TABS
25.0000 mg | ORAL_TABLET | Freq: Three times a day (TID) | ORAL | Status: DC
  Administered 2014-02-09 – 2014-02-13 (×13): 25 mg via ORAL
  Filled 2014-02-09 (×2): qty 1
  Filled 2014-02-09: qty 42
  Filled 2014-02-09 (×3): qty 1
  Filled 2014-02-09: qty 42
  Filled 2014-02-09 (×9): qty 1
  Filled 2014-02-09: qty 42
  Filled 2014-02-09 (×7): qty 1

## 2014-02-09 MED ORDER — LORATADINE 10 MG PO TABS
10.0000 mg | ORAL_TABLET | Freq: Every day | ORAL | Status: DC
  Administered 2014-02-09 – 2014-02-12 (×2): 10 mg via ORAL
  Filled 2014-02-09 (×7): qty 1

## 2014-02-09 MED ORDER — QUETIAPINE FUMARATE 50 MG PO TABS
50.0000 mg | ORAL_TABLET | ORAL | Status: DC
  Filled 2014-02-09 (×3): qty 1

## 2014-02-09 MED ORDER — ACETAMINOPHEN 325 MG PO TABS
650.0000 mg | ORAL_TABLET | Freq: Four times a day (QID) | ORAL | Status: DC | PRN
  Administered 2014-02-11 – 2014-02-13 (×4): 650 mg via ORAL
  Filled 2014-02-09 (×4): qty 2

## 2014-02-09 NOTE — Progress Notes (Signed)
Adult Psychoeducational Group Note  Date:  02/09/2014 Time:  9:00 AM  Group Topic/Focus:  Morning Wellness Group  Participation Level:  Active  Participation Quality:  Appropriate and Attentive  Affect:  Appropriate  Cognitive:  Alert and Oriented  Insight: Appropriate  Engagement in Group:  Engaged  Modes of Intervention:  Discussion  Additional Comments: Patient's goal for today is to speak with the doctor and start her medications.  Melanee SpryRonecia E Yaniris Braddock 02/09/2014, 10:04 AM

## 2014-02-09 NOTE — [facility] Group Notes (Signed)
[facility] LCSW Group Therapy  Living A Balanced Life  1:15 - 2: 30          02/09/2014    Type of Therapy:  Group Therapy  Participation Level:  Appropriate  Participation Quality:  Appropriate  Affect:  Appropriate  Cognitive:  Attentive Appropriate  Insight: Developing/Improving  Engagement in Therapy:  Developing/Improving  Modes of Intervention:  Discussion Exploration Problem-Solving Supportive   Summary of Progress/Problems: Topic for group was Living a Balanced Life.  Patient was able to show how life has become unbalanced.  She advised of spending all her time with her two year old daughter and family.  She shared she has a need to spend more time doing things to better herself.  Patient able to  Identify appropriate coping skills.   Wynn BankerHodnett, Quinterrius Errington Hairston 02/09/2014
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
## 2014-02-09 NOTE — Progress Notes (Signed)
D: Patient appropriate and cooperative with staff and peers. Patient has anxious affect and mood. She reported on the self inventory sheet that sleep, appetite and ability to pay attention are poor and energy level is normal. Patient rates depression and feelings of hopelessness "9". She's participating in groups, visible in the milieu and interactive with peers on the unit. Patient is compliant with all medications.  A: Support and encouragement provided to patient. Scheduled medications administered per MD orders. Maintain Q15 minute checks for safety.  R: Patient receptive. Denies SI/HI and AVH. Patient remains safe.

## 2014-02-09 NOTE — [facility] Suicide Risk Assessment (Signed)
[facility] INPATIENT:  Family/Significant Other Suicide Prevention Education  Suicide Prevention Education:  Contact Attempts: [name], Grandmother, [phone];  has been identified by the patient as the family member/significant other with whom the patient will be residing, and identified as the person(s) who will aid the patient in the event of a mental health crisis.  With written consent from the patient, two attempts were made to provide suicide prevention education, prior to and/or following the patient's discharge.  We were unsuccessful in providing suicide prevention education.  A suicide education pamphlet was given to the patient to share with family/significant other.  Date and time of first attempt: Feb 09, 2014 @ 1:22 PM Date and time of second attempt  Claire Taylor Claire Taylor 02/09/2014, 1:21 PM
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
## 2014-02-09 NOTE — Progress Notes (Signed)
Attended group 
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
## 2014-02-09 NOTE — Progress Notes (Signed)
D:  Pt +ve AVH- not command. Pt denies SI/HI. Pt is pleasant and cooperative. Pt forwards little personal information to Clinical research associatewriter. Pt would not discuss reason for taking vaginal cream with Clinical research associatewriter. After pt took her night meds she came to the med window stating I am still anxious and my meds have kicked in.   A: Pt was offered support and encouragement. Pt was given scheduled medications. Pt was encourage to attend groups. Q 15 minute checks were done for safety.   R:Pt attends groups and interacts well with peers and staff. Pt is taking medication. Pt receptive to treatment and safety maintained on unit.

## 2014-02-10 DIAGNOSIS — F329 Major depressive disorder, single episode, unspecified: Secondary | ICD-10-CM

## 2014-02-10 DIAGNOSIS — F3289 Other specified depressive episodes: Secondary | ICD-10-CM

## 2014-02-10 MED ORDER — MENTHOL 3 MG MT LOZG
1.0000 | LOZENGE | OROMUCOSAL | Status: DC | PRN
  Administered 2014-02-13 (×2): 3 mg via ORAL

## 2014-02-10 MED ORDER — QUETIAPINE FUMARATE 300 MG PO TABS
300.0000 mg | ORAL_TABLET | Freq: Every day | ORAL | Status: DC
  Administered 2014-02-10 – 2014-02-12 (×3): 300 mg via ORAL
  Filled 2014-02-10: qty 14
  Filled 2014-02-10 (×5): qty 1

## 2014-02-10 NOTE — [facility] Group Notes (Signed)
[facility] LCSW Group Therapy  02/10/2014 1:15 PM   Type of Therapy:  Group Therapy  Participation Level:  Did Not Attend - pt sleeping in her room  Claire IvanChelsea Horton, LCSW 02/10/2014 3:02 PM

## 2014-02-10 NOTE — Progress Notes (Signed)
[facility] Note  02/10/2014 11:51 AM Rip HarbourCeletha Taylor  MRN:  [id number] Subjective:  Patient was seen and chart reviewed. Patient complained having symptoms of depression, anxiety, hopelessness and suicidal ideation. Patient reported she was talking to herself and to sleep and extremely jumpy at this time to sleep. Patient reported her depression as 8/10, hopelessness 8/10 and anxiety 10 out of 10 and also reported she continued to have suicidal ideation but contracts for safety while in the hospital. Patient is hoping to go home after treatment with her mother. Patient has been compliant with her medication and actively participated in unit activities including group therapies. Diagnosis:   DSM5: Schizophrenia Disorders:   Obsessive-Compulsive Disorders:   Trauma-Stressor Disorders:   Substance/Addictive Disorders:   Depressive Disorders:   Total Time spent with patient: 30 minutes  Axis I: Bipolar, Depressed  ADL's:  Impaired  Sleep: Poor  Appetite:  Fair  Suicidal Ideation:  Patient endorses suicidal ideation but contracts for safety while in the hospital Homicidal Ideation:  Denied AEB (as evidenced by):  Psychiatric Specialty Exam: Physical Exam  ROS  Blood pressure 117/80, pulse 103, temperature 97.4 F (36.3 C), temperature source Oral, resp. rate 18, height 5' 4.57" (1.64 m), weight 106.142 kg (234 lb).Body mass index is 39.46 kg/(m^2).  General Appearance: Fairly Groomed  Patent attorneyye Contact::  Fair  Speech:  Clear and Coherent and Slow  Volume:  Decreased  Mood:  Anxious, Depressed, Hopeless and Worthless  Affect:  Depressed and Flat  Thought Process:  Coherent and Goal Directed  Orientation:  Full (Time, Place, and Person)  Thought Content:  WDL  Suicidal Thoughts:  Yes.  without intent/plan  Homicidal Thoughts:  No  Memory:  Immediate;   Fair  Judgement:  Impaired  Insight:  Lacking  Psychomotor Activity:  Psychomotor Retardation  Concentration:  Fair  Recall:   Fair  Fund of Knowledge:Good  Language: Good  Akathisia:  NA  Handed:  Right  AIMS (if indicated):     Assets:  Communication Skills Desire for Improvement Financial Resources/Insurance Housing Physical Health Resilience Social Support Talents/Skills Transportation  Sleep:  Number of Hours: 4   Musculoskeletal: Strength & Muscle Tone: within normal limits Gait & Station: normal Patient leans: N/A  Current Medications: Current Facility-Administered Medications  Medication Dose Route Frequency Provider Last Rate Last Dose  . acetaminophen (TYLENOL) tablet 650 mg  650 mg Oral Q6H PRN Verne SpurrNeil Mashburn, PA-C      . docusate sodium (COLACE) capsule 100 mg  100 mg Oral QHS Verne SpurrNeil Mashburn, PA-C   100 mg at 02/09/14 2137  . ibuprofen (ADVIL,MOTRIN) tablet 800 mg  800 mg Oral QID PRN Nanine MeansJamison Lord, NP   800 mg at 02/10/14 0857  . loratadine (CLARITIN) tablet 10 mg  10 mg Oral QHS Verne SpurrNeil Mashburn, PA-C   10 mg at 02/09/14 2137  . menthol-cetylpyridinium (CEPACOL) lozenge 3 mg  1 lozenge Oral PRN Nehemiah SettleJanardhaha R Kier Smead, MD      . metroNIDAZOLE (METROGEL) 0.75 % vaginal gel 1 Applicatorful  1 Applicatorful Vaginal BID Verne SpurrNeil Mashburn, PA-C   1 Applicatorful at 02/10/14 0805  . nicotine (NICODERM CQ - dosed in mg/24 hours) patch 21 mg  21 mg Transdermal Daily Nehemiah SettleJanardhaha R Jesica Goheen, MD   21 mg at 02/10/14 0801  . QUEtiapine (SEROQUEL) tablet 200 mg  200 mg Oral QHS Verne SpurrNeil Mashburn, PA-C   200 mg at 02/09/14 2137  . QUEtiapine (SEROQUEL) tablet 25 mg  25 mg Oral TID Verne SpurrNeil Mashburn, PA-C  25 mg at 02/10/14 0801  . traZODone (DESYREL) tablet 50 mg  50 mg Oral QHS Verne SpurrNeil Mashburn, PA-C   50 mg at 02/09/14 2137    Lab Results: No results found for this or any previous visit (from the past 48 hour(s)).  Physical Findings: AIMS: Facial and Oral Movements Muscles of Facial Expression: None, normal Lips and Perioral Area: None, normal Jaw: None, normal Tongue: None, normal,Extremity Movements Upper  (arms, wrists, hands, fingers): None, normal Lower (legs, knees, ankles, toes): None, normal, Trunk Movements Neck, shoulders, hips: None, normal, Overall Severity Severity of abnormal movements (highest score from questions above): None, normal Incapacitation due to abnormal movements: None, normal Patient's awareness of abnormal movements (rate only patient's report): No Awareness, Dental Status Current problems with teeth and/or dentures?: No Does patient usually wear dentures?: No  CIWA:    COWS:     Treatment Plan Summary: Daily contact with patient to assess and evaluate symptoms and progress in treatment Medication management  Plan: Treatment Plan/Recommendations:   1. Admit for crisis management and stabilization. 2. Medication management to reduce current symptoms to base line and improve the patient's overall level of functioning. Increase Seroquel 200 mg at bedtime and discontinue trazodone because of nightmares  3. Treat health problems as indicated. 4. Develop treatment plan to decrease risk of relapse upon discharge and to reduce the need for readmission. 5. Psycho-social education regarding relapse prevention and self care. 6. Health care follow up as needed for medical problems. 7. Restart home medications where appropriate. 8. Disposition plans are in progress and discharged planning will be discussed on Monday morning based on her clinical improvement during this weekend.   Medical Decision Making Problem Points:  Established problem, worsening (2), New problem, with no additional work-up planned (3), Review of last therapy session (1), Review of psycho-social stressors (1) and Self-limited or minor (1) Data Points:  Review or order clinical lab tests (1) Review or order medicine tests (1) Review of medication regiment & side effects (2) Review of new medications or change in dosage (2)  I certify that inpatient services furnished can reasonably be expected to  improve the patient's condition.   Randal BubaJanardhaha R Kamariya Blevens 02/10/2014, 11:51 AM

## 2014-02-10 NOTE — Progress Notes (Signed)
Adult Psychoeducational Group Note  Date:  02/10/2014 Time:  9:11 PM  Group Topic/Focus:  Wrap-Up Group:   The focus of this group is to help patients review their daily goal of treatment and discuss progress on daily workbooks.  Participation Level:  Active  Participation Quality:  Appropriate and Attentive  Affect:  Appropriate  Cognitive:  Alert and Appropriate  Insight: Good  Engagement in Group:  Engaged  Modes of Intervention:  Discussion, Exploration, Socialization and Support  Additional Comments:  Pt came to group and shared that she had a bad day but it was fixed by her moving rooms and getting a new roommate. Pt also shared she wants to speak with the doctor about some other concerns. Pt was encouraged to come to staff with any concerns and pt agreed that she would.   Claire Taylor 02/10/2014, 9:11 PM

## 2014-02-10 NOTE — Progress Notes (Addendum)
NUTRITION ASSESSMENT  Pt identified as at risk on the Malnutrition Screen Tool  INTERVENTION: 1. Educated patient on the importance of nutrition and encouraged intake of food and beverages. 2. Discussed weight goals. 3. Supplements: none at this time.  NUTRITION DIAGNOSIS: Predicted sub optimal intake related to depression AEB patient report.  Goal: Pt to meet >/= 90% of their estimated nutrition needs.  Monitor:  PO intake  Assessment:  Patient admitted s/p OD attempt.  Hx includes bipolar, THC abuse.    Patient with weight of 193 lbs 11 months ago.  States that "Seriquel made me gain weight." but then very poor appetite for a very long time.  Current poor intake and appetite.  Encouraged.  23 y.o. female  Height: Ht Readings from Last 1 Encounters:  02/08/14 5' 4.57" (1.64 m)    Weight: Wt Readings from Last 1 Encounters:  02/08/14 234 lb (106.142 kg)    Weight Hx: Wt Readings from Last 10 Encounters:  02/08/14 234 lb (106.142 kg)  03/10/13 193 lb (87.544 kg)    BMI:  Body mass index is 39.46 kg/(m^2). Pt meets criteria for obesity grade 2 based on current BMI.  Estimated Nutritional Needs: Kcal: 25-30 kcal/kg Protein: > 1 gram protein/kg Fluid: 1 ml/kcal  Diet Order: General Pt is also offered choice of unit snacks mid-morning and mid-afternoon.  Pt is eating as desired.   Lab results and medications reviewed.   Oran ReinLaura Jobe, RD, LDN Clinical Inpatient Dietitian Pager:  [phone] Weekend and after hours pager:  [phone]

## 2014-02-10 NOTE — [facility] Suicide Risk Assessment (Signed)
[facility] INPATIENT:  Family/Significant Other Suicide Prevention Education  Suicide Prevention Education:  Education Completed; Wilhemina BonitoJacqueline Torain  - (grandmother) - [phone],  (name of family member/significant other) has been identified by the patient as the family member/significant other with whom the patient will be residing, and identified as the person(s) who will aid the patient in the event of a mental health crisis (suicidal ideations/suicide attempt).  With written consent from the patient, the family member/significant other has been provided the following suicide prevention education, prior to the and/or following the discharge of the patient.  The suicide prevention education provided includes the following:  Suicide risk factors  Suicide prevention and interventions  National Suicide Hotline telephone number  [facility] assessment telephone number  [facility] Emergency Assistance 911  [facility] and/or Residential Mobile Crisis Unit telephone number  Request made of family/significant other to:  Remove weapons (e.g., guns, rifles, knives), all items previously/currently identified as safety concern.    Remove drugs/medications (over-the-counter, prescriptions, illicit drugs), all items previously/currently identified as a safety concern.  The family member/significant other verbalizes understanding of the suicide prevention education information provided.  The family member/significant other agrees to remove the items of safety concern listed above.  Sayed Apostol N Horton 02/10/2014, 3:20 PM

## 2014-02-10 NOTE — Progress Notes (Signed)
D:  Patient up and active in the milieu most of the shift.  She has requested to be moved if another room becomes available.  She rates her depression, hopelessness, and anxiety as an 8 today, but is seen up in the dayroom interacting with peers, laughing and engaged in activities.  She states her sleep was not particularly restful last night due to nightmares.  She continues to have some off and on suicidal thoughts but agrees to seek out staff if feeling unsafe.  A:  Medications given as prescribed.  On environmental rounds today a cigarette and lighter were found in the patients room.  On approach she admitted to having them and also stated she has had no visitors [facility].  Reinforced unit rules and [place] smoking policy.  Offered support and encouragement.   R:  Cooperative with staff.  Interacting well with peers but states she does not feel comfortable around her roommate.  Safety is maintained.

## 2014-02-10 NOTE — Progress Notes (Signed)
Patient ID: Claire Taylor, female   DOB: 1991-06-09, 23 y.o.   MRN: [id number] D: Patient in room on approach. Pt mood and affect appeared depressed and anxious. Pt reports outside stressor as planning her wedding, and not home to take care of daughter. Pt rates anxiety as 10 and depression as 1 on a 0-10 scale. Pt denies SI/HI/AVH. Pt attended evening wrap up group and engage in discussion.  Cooperative with assessment. No acute distressed noted at this time.   A: Met with pt 1:1. Medications administered as prescribed. Writer encouraged pt to discuss feelings and use coping skills. Pt encouraged to come to staff with any question or concerns.   R: Patient remains safe. She is complaint with medications and denies any adverse reaction. Continue current POC.

## 2014-02-10 NOTE — [facility] Group Notes (Signed)
[facility]   02/10/2014 8:45 AM  Participation Quality:  Alert, Appropriate and Oriented  Mood/Affect:  Flat and Depressed  Depression Rating:  2  Anxiety Rating:  2  Thoughts of Suicide:  Pt denies SI/HI  Will you contract for safety?   Yes  Current AVH:  Pt denies  Plan for Discharge/Comments:  Pt attended discharge planning group and actively participated in group.  CSW provided pt with today's workbook.  Pt reports that she "could be better".  Pt states that [place] with family support and has [facility] medication management.  No further needs voiced by pt at this time.    Transportation Means: Pt reports access to transportation - family will pick pt up  Supports: Family is supportive  Reyes IvanChelsea Horton, LCSW 02/10/2014 10:39 AM

## 2014-02-10 NOTE — Tx Team (Signed)
Interdisciplinary Treatment Plan Update (Adult)  Date: 02/10/2014  Time Reviewed:  9:45 AM  Progress in Treatment: Attending groups: Yes Participating in groups:  Yes Taking medication as prescribed:  Yes Tolerating medication:  Yes Family/Significant othe contact made: No, attempts made  Patient understands diagnosis:  Yes Discussing patient identified problems/goals with staff:  Yes Medical problems stabilized or resolved:  Yes Denies suicidal/homicidal ideation: Yes Issues/concerns per patient self-inventory:  Yes Other:  New problem(s) identified: N/A  Discharge Plan or Barriers: Pt [facility] and therapy.    Reason for Continuation of Hospitalization: Anxiety Depression Medication Stabilization  Comments: N/A  Estimated length of stay: 3-4 days  For review of initial/current patient goals, please see plan of care.  Attendees: Patient:     Family:     Physician:  Dr. Javier GlazierJohnalagadda 02/10/2014 9:57 AM   Nursing:   Lamount Crankerhris Judge, RN 02/10/2014 9:57 AM   Clinical Social Worker:  Reyes Ivanhelsea Horton, LCSW 02/10/2014 9:57 AM   Other: Quintella ReichertBeverly Knight, RN 02/10/2014 9:57 AM   Other:  Izola Priceawnaly Dax, RN 02/10/2014 9:57 AM   Other:  Onnie BoerJennifer Clark, case manager 02/10/2014 9:57 AM   Other:  Tomasita Morrowelora Sutton, care coordination 02/10/2014 9:58 AM   Other:    Other:    Other:    Other:    Other:    Other:     Scribe for Treatment Team:   Carmina MillerHorton, Sharetta Ricchio Nicole, 02/10/2014 9:57 AM

## 2014-02-11 DIAGNOSIS — F3162 Bipolar disorder, current episode mixed, moderate: Principal | ICD-10-CM

## 2014-02-11 DIAGNOSIS — R45851 Suicidal ideations: Secondary | ICD-10-CM

## 2014-02-11 MED ORDER — PSEUDOEPHEDRINE HCL 30 MG PO TABS
30.0000 mg | ORAL_TABLET | Freq: Three times a day (TID) | ORAL | Status: DC | PRN
  Administered 2014-02-11 – 2014-02-12 (×2): 30 mg via ORAL
  Filled 2014-02-11 (×2): qty 1

## 2014-02-11 MED ORDER — CITALOPRAM HYDROBROMIDE 10 MG PO TABS
10.0000 mg | ORAL_TABLET | Freq: Every day | ORAL | Status: DC
  Administered 2014-02-11 – 2014-02-13 (×3): 10 mg via ORAL
  Filled 2014-02-11 (×3): qty 1
  Filled 2014-02-11: qty 14
  Filled 2014-02-11 (×4): qty 1

## 2014-02-11 MED ORDER — GUAIFENESIN ER 600 MG PO TB12
600.0000 mg | ORAL_TABLET | Freq: Two times a day (BID) | ORAL | Status: DC | PRN
  Administered 2014-02-11 – 2014-02-12 (×2): 600 mg via ORAL
  Filled 2014-02-11 (×2): qty 1

## 2014-02-11 NOTE — Progress Notes (Signed)
Psychoeducational Group Note  Date: 02/11/2014 Time:  1015  Group Topic/Focus:  Identifying Needs:   The focus of this group is to help patients identify their personal needs that have been historically problematic and identify healthy behaviors to address their needs.  Participation Level:  Did not attend  Claire Taylor  

## 2014-02-11 NOTE — Progress Notes (Signed)
D) Pt states that she is not feeling well today. Has requested several cough drops to help ease her discomfort. Affect is flat and mood depressed. Rates her depression and hopelessness both at an 8. Denies SI and HI. Has not attended the program today due to "my throat is swollen and I can hardly swallow".  A) Provided Pt with coulgh drops and warm salt water to gargle with. Given support and reassurance along with praise. R) Denies SI and HI. Pt not feeing physically well and is not partisipating in the program today.

## 2014-02-11 NOTE — Progress Notes (Signed)
Patient ID: Claire Taylor, female   DOB: [birth year], 23 y.o.   MRN: [id number] [facility]  02/11/2014 3:47 PM Claire Taylor  MRN:  [id number] Subjective:   Patient states "I have a lot of stress. I'm trying to move. I've been off my medications for six months. I am still very depressed and anxious. I have been hearing voices. They are getting better now with the Seroquel. But I really need something for depression. I am also having cold symptoms."  Objective:  Patient assessed in her room today. She reports feeling bad due to symptoms of URI. Vitals are stable with no documented fevers. Rates her depression at seven and anxiety at "10 plus". Patient reports ongoing symptoms of depression, mood instability, anxiety, and auditory hallucinations. Kristie is compliant with her medications. She is endorsing multiple life stressors that are hard for her to cope with. Patient also talks about the recent death of a cousin. Patient is reporting decreased thoughts of suicide. She is now grateful that her grandmother prevented her from overdosing.   Diagnosis:   DSM5: Schizophrenia Disorders:   Obsessive-Compulsive Disorders:   Trauma-Stressor Disorders:   Substance/Addictive Disorders:   Depressive Disorders:   Total Time spent with patient: 20 minutes  Axis I: Bipolar 1 disorder, mixed, moderate   ADL's:  Impaired  Sleep: Poor  Appetite:  Fair  Suicidal Ideation:  Patient endorses suicidal ideation but contracts for safety while in the hospital Homicidal Ideation:  Denied AEB (as evidenced by):  Psychiatric Specialty Exam: Physical Exam  Review of Systems  Constitutional: Positive for malaise/fatigue.  HENT: Positive for congestion and sore throat.   Eyes: Negative.   Respiratory: Positive for cough.   Cardiovascular: Negative.   Gastrointestinal: Negative.   Genitourinary: Negative.   Musculoskeletal: Negative.   Skin: Negative.   Neurological: Negative.    Endo/Heme/Allergies: Negative.   Psychiatric/Behavioral: Positive for depression, suicidal ideas and hallucinations. Negative for memory loss. The patient is nervous/anxious and has insomnia.     Blood pressure 126/86, pulse 102, temperature 97.4 F (36.3 C), temperature source Oral, resp. rate 20, height 5' 4.57" (1.64 m), weight 106.142 kg (234 lb).Body mass index is 39.46 kg/(m^2).  General Appearance: Fairly Groomed  Patent attorneyye Contact::  Fair  Speech:  Clear and Coherent and Slow  Volume:  Decreased  Mood:  Anxious, Depressed, Hopeless and Worthless  Affect:  Depressed and Flat  Thought Process:  Coherent and Goal Directed  Orientation:  Full (Time, Place, and Person)  Thought Content:  Hallucinations: Auditory and Rumination  Suicidal Thoughts:  Yes.  without intent/plan  Homicidal Thoughts:  No  Memory:  Immediate;   Fair  Judgement:  Impaired  Insight:  Lacking  Psychomotor Activity:  Psychomotor Retardation  Concentration:  Fair  Recall:  Fair  Fund of Knowledge:Good  Language: Good  Akathisia:  NA  Handed:  Right  AIMS (if indicated):     Assets:  Communication Skills Desire for Improvement Financial Resources/Insurance Housing Physical Health Resilience Social Support Talents/Skills Transportation  Sleep:  Number of Hours: 4   Musculoskeletal: Strength & Muscle Tone: within normal limits Gait & Station: normal Patient leans: N/A  Current Medications: Current Facility-Administered Medications  Medication Dose Route Frequency Provider Last Rate Last Dose  . acetaminophen (TYLENOL) tablet 650 mg  650 mg Oral Q6H PRN Verne SpurrNeil Mashburn, PA-C      . citalopram (CELEXA) tablet 10 mg  10 mg Oral Daily Fransisca KaufmannLaura Davis, NP      . docusate sodium (  COLACE) capsule 100 mg  100 mg Oral QHS Verne SpurrNeil Mashburn, PA-C   100 mg at 02/10/14 2124  . guaiFENesin (MUCINEX) 12 hr tablet 600 mg  600 mg Oral BID PRN Fransisca KaufmannLaura Davis, NP      . ibuprofen (ADVIL,MOTRIN) tablet 800 mg  800 mg Oral QID PRN  Nanine MeansJamison Lord, NP   800 mg at 02/10/14 2127  . loratadine (CLARITIN) tablet 10 mg  10 mg Oral QHS Verne SpurrNeil Mashburn, PA-C   10 mg at 02/09/14 2137  . menthol-cetylpyridinium (CEPACOL) lozenge 3 mg  1 lozenge Oral PRN Nehemiah SettleJanardhaha R Jonnalagadda, MD      . metroNIDAZOLE (METROGEL) 0.75 % vaginal gel 1 Applicatorful  1 Applicatorful Vaginal BID Verne SpurrNeil Mashburn, PA-C   1 Applicatorful at 02/11/14 [phone]  . nicotine (NICODERM CQ - dosed in mg/24 hours) patch 21 mg  21 mg Transdermal Daily Nehemiah SettleJanardhaha R Jonnalagadda, MD   21 mg at 02/11/14 [id number]  . pseudoephedrine (SUDAFED) tablet 30 mg  30 mg Oral Q8H PRN Fransisca KaufmannLaura Davis, NP      . QUEtiapine (SEROQUEL) tablet 25 mg  25 mg Oral TID Verne SpurrNeil Mashburn, PA-C   25 mg at 02/11/14 1224  . QUEtiapine (SEROQUEL) tablet 300 mg  300 mg Oral QHS Nehemiah SettleJanardhaha R Jonnalagadda, MD   300 mg at 02/10/14 2124    Lab Results: No results found for this or any previous visit (from the past 48 hour(s)).  Physical Findings: AIMS: Facial and Oral Movements Muscles of Facial Expression: None, normal Lips and Perioral Area: None, normal Jaw: None, normal Tongue: None, normal,Extremity Movements Upper (arms, wrists, hands, fingers): None, normal Lower (legs, knees, ankles, toes): None, normal, Trunk Movements Neck, shoulders, hips: None, normal, Overall Severity Severity of abnormal movements (highest score from questions above): None, normal Incapacitation due to abnormal movements: None, normal Patient's awareness of abnormal movements (rate only patient's report): No Awareness, Dental Status Current problems with teeth and/or dentures?: No Does patient usually wear dentures?: No  CIWA:    COWS:     Treatment Plan Summary: Daily contact with patient to assess and evaluate symptoms and progress in treatment Medication management  Plan: Treatment Plan/Recommendations:   1. Continue crisis management and stabilization. 2. Medication management to reduce current symptoms to base line  and improve the patient's overall level of functioning. Continue Seroquel 300 mg at bedtime and 25 mg TID for improved mood stability/psychosis, Start Celexa 10 mg daily for depression 3. Treat health problems as indicated. Start Sudafed prn symptoms of congestion, Mucinex prn cough/mucus production, Claritin 10 mg hs for congestion.  4. Develop treatment plan to decrease risk of relapse upon discharge and to reduce the need for readmission. 5. Psycho-social education regarding relapse prevention and self care. 6. Health care follow up as needed for medical problems. Continue Metrogel BID for bacterial vaginal infection.  7. Restart home medications where appropriate. 8. Disposition plans are in progress and discharged planning will be discussed on Monday morning based on her clinical improvement during this weekend.  Medical Decision Making Problem Points:  Established problem, stable/improving (1), Review of last therapy session (1) and Review of psycho-social stressors (1) Data Points:  Review or order clinical lab tests (1) Review or order medicine tests (1) Review of medication regiment & side effects (2) Review of new medications or change in dosage (2)  I certify that inpatient services furnished can reasonably be expected to improve the patient's condition.   Fransisca KaufmannLaura Davis NP-C 02/11/2014, 3:47 PM  I agreed with  the findings, treatment and disposition plan of this patient. Berniece Andreas, MD

## 2014-02-11 NOTE — BH Assessment (Signed)
Assessment Note  Claire Taylor is an 23 y.o. female.  "This is a 23 Y. O. Single female, who presents to the ED for C/O stating , "I tried to kill myself by trying to take a handful of pills; but my Mother stopped me.  I was trying to take some Prozac; I have so much going on in my life; I just want it all to be over; it was 1/2 bottle of Prozac pills, "I took too much to talk about; my Grand Mother  Brought me here."  Assessment originally completed by Dwan BoltMargaret Holland, RN.   Axis I: Cannabis Use Disorder, Alcohol Use Disorder Axis II: Deferred Axis IV: housing problems and problems with primary support group Axis V: 11-20 some danger of hurting self or others possible OR occasionally fails to maintain minimal personal hygiene OR gross impairment in communication  Past Medical History:  Past Medical History  Diagnosis Date  . Depression   . Anxiety   . H/O umbilical hernia repair     Past Surgical History  Procedure Laterality Date  . Adenoidectomy      Family History: History reviewed. No pertinent family history.  Social History:  reports that she has been smoking.  She does not have any smokeless tobacco history on file. She reports that she drinks alcohol. She reports that she uses illicit drugs (Marijuana).  Additional Social History:     CIWA: CIWA-Ar BP: 117/80 mmHg Pulse Rate: 103 COWS:    Allergies:  Allergies  Allergen Reactions  . Banana Other (See Comments)    "Makes my tongue and mouth itch"  . Mylanta [Alum & Mag Hydroxide-Simeth] Other (See Comments)    "It does the exact opposite of what it is supposed to do"  . Relpax [Eletriptan] Other (See Comments)    "My jaw locks up"    Home Medications:  Medications Prior to Admission  Medication Sig Dispense Refill  . clonazePAM (KLONOPIN) 0.5 MG tablet Take 0.5 mg by mouth 2 (two) times daily.      . fluconazole (DIFLUCAN) 150 MG tablet Take 150 mg by mouth daily.      . [DISCONTINUED] clonazePAM (KLONOPIN)  0.5 MG tablet Take 1 tablet (0.5 mg total) by mouth 2 (two) times daily.  30 tablet  0    OB/GYN Status:  No LMP recorded. Patient is not currently having periods (Reason: IUD).  General Assessment Data [facility] ACT Assessment: Yes Is this a Tele or Face-to-Face Assessment?: Tele Assessment Is this an Initial Assessment or a Re-assessment for this encounter?: Initial Assessment Living Arrangements: Parent;Other relatives (Grand Mother) Can pt return to current living arrangement?: Yes Admission Status: Involuntary Is patient capable of signing voluntary admission?: No Transfer from: Acute Hospital Referral Source: MD  Medical Screening Exam [facility] Walk-in ONLY) Medical Exam completed: Yes  [facility] Living Arrangements: Parent;Other relatives (Grand Mother) Name of Psychiatrist: None Name of Therapist: None  Education Status Is patient currently in school?: No Current Grade: N/A Highest grade of school patient has completed: N/A Name of school: N/A Contact person: N/A  Risk to self Suicidal Ideation: Yes-Currently Present Suicidal Intent: Yes-Currently Present Is patient at risk for suicide?: Yes Suicidal Plan?: Yes-Currently Present Specify Current Suicidal Plan: OD on pills Access to Means: No What has been your use of drugs/alcohol within the last 12 months?: [place] and Alcohol Previous Attempts/Gestures: Yes How many times?: 1 Other Self Harm Risks: 0 Triggers for Past Attempts: Other (Comment) (  feeling overwhelmed) Intentional Self Injurious Behavior: None Family Suicide History: Unknown Recent stressful life event(s):  (4710 mo old baby) Persecutory voices/beliefs?: No Depression: Yes Depression Symptoms: Despondent;Feeling angry/irritable Substance abuse history and/or treatment for substance abuse?: Yes Suicide prevention information given to non-admitted patients: Not applicable  Risk to Others Homicidal  Ideation: No Thoughts of Harm to Others: No Current Homicidal Intent: No Current Homicidal Plan: No Access to Homicidal Means: No Identified Victim: N/A History of harm to others?: No Assessment of Violence: None Noted Violent Behavior Description: none Does patient have access to weapons?: No Criminal Charges Pending?: No Does patient have a court date: No  Psychosis Hallucinations: None noted Delusions: None noted  Mental Status Report Appear/Hygiene: Unremarkable Eye Contact: Fair Motor Activity: Unremarkable Speech: Logical/coherent Level of Consciousness: Alert Mood: Depressed Affect: Depressed Anxiety Level: Moderate Thought Processes: Coherent Judgement: Impaired Obsessive Compulsive Thoughts/Behaviors: None  Cognitive Functioning Memory: Recent Intact;Remote Intact IQ: Average Insight: Fair Impulse Control: Poor Appetite: Good Weight Loss: 0 Weight Gain: 0 Sleep: No Change Total Hours of Sleep: 7 Vegetative Symptoms: None  ADLScreening [facility] Assessment Services) Patient's cognitive ability adequate to safely complete daily activities?: Yes Patient able to express need for assistance with ADLs?: Yes Independently performs ADLs?: Yes (appropriate for developmental age)  Prior Inpatient Therapy Prior Inpatient Therapy: No Prior Therapy Facilty/Provider(s): N/A Reason for Treatment: N/A  Prior Outpatient Therapy Prior Outpatient Therapy: No Prior Therapy Dates: N/A Prior Therapy Facilty/Provider(s): N/A Reason for Treatment: N/A  ADL Screening (condition at time of admission) Patient's cognitive ability adequate to safely complete daily activities?: Yes Is the patient deaf or have difficulty hearing?: No Does the patient have difficulty seeing, even when wearing glasses/contacts?: No Does the patient have difficulty concentrating, remembering, or making decisions?: No Patient able to express need for assistance with ADLs?: Yes Does the patient have  difficulty dressing or bathing?: No Independently performs ADLs?: Yes (appropriate for developmental age) Does the patient have difficulty walking or climbing stairs?: Yes Weakness of Legs: None Weakness of Arms/Hands: None  Home Assistive Devices/Equipment Home Assistive Devices/Equipment: None  Therapy Consults (therapy consults require a physician order) PT Evaluation Needed: No OT Evalulation Needed: No SLP Evaluation Needed: No Abuse/Neglect Assessment (Assessment to be complete while patient is alone) Physical Abuse: Denies Verbal Abuse: Denies Sexual Abuse: Denies Exploitation of patient/patient's resources: Denies Self-Neglect: Denies Values / Beliefs Cultural Requests During Hospitalization: None Spiritual Requests During Hospitalization: None Consults Spiritual Care Consult Needed: No Social Work Consult Needed: No Merchant navy officerAdvance Directives (For Healthcare) Advance Directive: Patient does not have advance directive Nutrition Screen- [facility] Adult/[facility]/AP Patient's home diet: Regular  Additional Information 1:1 In Past 12 Months?: No CIRT Risk: No Elopement Risk: No Does patient have medical clearance?: Yes     Disposition:  Disposition Initial Assessment Completed for this Encounter: Yes Disposition of Patient: Inpatient treatment program Type of inpatient treatment program: Adult  On Site Evaluation by:   Reviewed with Physician:    Sherilyn CooterIvy Dey-Johnson 02/11/2014 5:01 AM

## 2014-02-11 NOTE — [facility] Group Notes (Signed)
[facility] LCSW Group Therapy Note  02/11/2014 1:15 PM  Type of Therapy and Topic:  Group Therapy: Avoiding Self-Sabotaging and Enabling Behaviors  Participation Level:  Did not attend  Catherine C Harrill, LCSW 

## 2014-02-12 MED ORDER — HYDROXYZINE HCL 25 MG PO TABS
25.0000 mg | ORAL_TABLET | Freq: Three times a day (TID) | ORAL | Status: DC | PRN
  Administered 2014-02-12 – 2014-02-13 (×3): 25 mg via ORAL
  Filled 2014-02-12: qty 30
  Filled 2014-02-12 (×3): qty 1

## 2014-02-12 NOTE — Progress Notes (Signed)
Psychoeducational Group Note  Date: 02/12/2014 Time  0930  Group Topic/Focus:  Gratefulness:  The focus of this group is to help patients identify what two things they are most grateful for in their lives. What helps ground them and to center them on their work to their recovery.  Participation Level:  Active  Participation Quality:  Appropriate  Affect:  Appropriate  Cognitive:  Oriented  Insight:  Improving  Engagement in Group:  Improving  Additional Comments:  Pt participated in the group.  Claire Taylor   

## 2014-02-12 NOTE — Progress Notes (Signed)
Writer spoke with patient at medication window and she requested motrin for pain in her abdomen. She has been observed by writer up in the dayroom laughing and talking with peers. She at times is observed flirting with female patients. She is very needy asking for various things in a short period of time. She denies si/hi/a/v hallucinations, complaint with medications scheduled. Safety maintained on unit with 15 min checks.

## 2014-02-12 NOTE — Progress Notes (Signed)
Patient ID: Claire HarbourCeletha Taylor, female   DOB: 12/25/1990, 23 y.o.   MRN: [id number] [facility]  02/12/2014 9:40 AM Claire HarbourCeletha Taylor  MRN:  [id number] Subjective:   Patient states "My stress is really high, because I have a lot of going on. [place] moving to [place], and I just found out I have to move within two weeks because is not on the lease. My [place]. I have not been able to go to another funeral since my child's father passed away 07/2012. I am also getting married to my high school sweet heart.I have been hearing voices. They are getting better now with the Seroquel. But I really need something for depression. I am also having cold symptoms." I will say the Celexa is helping a lil bit, we may need to increase the dose on it. I was taking 30mg  Prozac with Seroquel prior to coming back in here. She states despite  everything going on listed above, she does feel like she is ready to go home tomorrow. She states she came in here stable, and now she is unstable. She is ready to go out and get her life together. She would like to try vistaril for sleep, instead of Trazadone.    Objective:  Patient assessed in group today. She is active in groups and milieu therapy. She reports feeling bad due to symptoms of URI. Vitals are stable with no documented fevers. Rates her depression at eight and anxiety at "10 ". Continues to endorse auditory/visual hallucinations only at night time. Arria is compliant with her medications. She is endorsing multiple life stressors that are hard for her to cope with. Patient is reporting decreased thoughts of suicide. She is thankful for her grandmother, who has her daughter and is taking care of her at this time. She wants Dr. Shela CommonsJ to know that she has been participating in group.   Diagnosis:   DSM5: Schizophrenia Disorders:   Obsessive-Compulsive Disorders:   Trauma-Stressor Disorders:   Substance/Addictive Disorders:   Depressive  Disorders:   Total Time spent with patient: 20 minutes  Axis I: Bipolar 1 disorder, mixed, moderate   ADL's:  Impaired  Sleep: Poor  Appetite:  Fair  Suicidal Ideation:  Patient endorses suicidal ideation but contracts for safety while in the hospital Homicidal Ideation:  Denied AEB (as evidenced by):  Psychiatric Specialty Exam: Physical Exam   Review of Systems  Constitutional: Positive for malaise/fatigue.  HENT: Positive for congestion and sore throat.   Eyes: Negative.   Respiratory: Positive for cough.   Cardiovascular: Negative.   Gastrointestinal: Negative.   Genitourinary: Negative.   Musculoskeletal: Negative.   Skin: Negative.   Neurological: Negative.   Endo/Heme/Allergies: Negative.   Psychiatric/Behavioral: Positive for depression, suicidal ideas and hallucinations. Negative for memory loss. The patient is nervous/anxious and has insomnia.     Blood pressure 121/82, pulse 83, temperature 97.7 F (36.5 C), temperature source Oral, resp. rate 18, height 5' 4.57" (1.64 m), weight 106.142 kg (234 lb).Body mass index is 39.46 kg/(m^2).  General Appearance: Fairly Groomed  Patent attorneyye Contact::  Fair  Speech:  Clear and Coherent and Slow  Volume:  Decreased  Mood:  Anxious, Depressed, Hopeless and Worthless  Affect:  Depressed and Flat  Thought Process:  Coherent and Goal Directed  Orientation:  Full (Time, Place, and Person)  Thought Content:  Hallucinations: Auditory and Rumination  Suicidal Thoughts:  Yes.  without intent/plan  Homicidal Thoughts:  No  Memory:  Immediate;   Fair  Judgement:  Impaired  Insight:  Lacking  Psychomotor Activity:  Psychomotor Retardation  Concentration:  Fair  Recall:  Fair  Fund of Knowledge:Good  Language: Good  Akathisia:  NA  Handed:  Right  AIMS (if indicated):     Assets:  Communication Skills Desire for Improvement Financial Resources/Insurance Housing Physical Health Resilience Social  Support Talents/Skills Transportation  Sleep:  Number of Hours: 5.75   Musculoskeletal: Strength & Muscle Tone: within normal limits Gait & Station: normal Patient leans: N/A  Current Medications: Current Facility-Administered Medications  Medication Dose Route Frequency Provider Last Rate Last Dose  . acetaminophen (TYLENOL) tablet 650 mg  650 mg Oral Q6H PRN Verne SpurrNeil Mashburn, PA-C   650 mg at 02/12/14 0925  . citalopram (CELEXA) tablet 10 mg  10 mg Oral Daily Fransisca KaufmannLaura Davis, NP   10 mg at 02/12/14 0814  . docusate sodium (COLACE) capsule 100 mg  100 mg Oral QHS Verne SpurrNeil Mashburn, PA-C   100 mg at 02/11/14 2122  . guaiFENesin (MUCINEX) 12 hr tablet 600 mg  600 mg Oral BID PRN Fransisca KaufmannLaura Davis, NP   600 mg at 02/12/14 [id number]  . ibuprofen (ADVIL,MOTRIN) tablet 800 mg  800 mg Oral QID PRN Nanine MeansJamison Lord, NP   800 mg at 02/11/14 2051  . loratadine (CLARITIN) tablet 10 mg  10 mg Oral QHS Verne SpurrNeil Mashburn, PA-C   10 mg at 02/09/14 2137  . menthol-cetylpyridinium (CEPACOL) lozenge 3 mg  1 lozenge Oral PRN Nehemiah SettleJanardhaha R Jonnalagadda, MD      . metroNIDAZOLE (METROGEL) 0.75 % vaginal gel 1 Applicatorful  1 Applicatorful Vaginal BID Verne SpurrNeil Mashburn, PA-C   1 Applicatorful at 02/11/14 1930  . nicotine (NICODERM CQ - dosed in mg/24 hours) patch 21 mg  21 mg Transdermal Daily Nehemiah SettleJanardhaha R Jonnalagadda, MD   21 mg at 02/12/14 0811  . pseudoephedrine (SUDAFED) tablet 30 mg  30 mg Oral Q8H PRN Fransisca KaufmannLaura Davis, NP   30 mg at 02/12/14 0816  . QUEtiapine (SEROQUEL) tablet 25 mg  25 mg Oral TID Verne SpurrNeil Mashburn, PA-C   25 mg at 02/12/14 [id number]  . QUEtiapine (SEROQUEL) tablet 300 mg  300 mg Oral QHS Nehemiah SettleJanardhaha R Jonnalagadda, MD   300 mg at 02/11/14 2122    Lab Results: No results found for this or any previous visit (from the past 48 hour(s)).  Physical Findings: AIMS: Facial and Oral Movements Muscles of Facial Expression: None, normal Lips and Perioral Area: None, normal Jaw: None, normal Tongue: None, normal,Extremity  Movements Upper (arms, wrists, hands, fingers): None, normal Lower (legs, knees, ankles, toes): None, normal, Trunk Movements Neck, shoulders, hips: None, normal, Overall Severity Severity of abnormal movements (highest score from questions above): None, normal Incapacitation due to abnormal movements: None, normal Patient's awareness of abnormal movements (rate only patient's report): No Awareness, Dental Status Current problems with teeth and/or dentures?: No Does patient usually wear dentures?: No  CIWA:    COWS:     Treatment Plan Summary: Daily contact with patient to assess and evaluate symptoms and progress in treatment Medication management  Plan: Treatment Plan/Recommendations:   1. Continue crisis management and stabilization. 2. Medication management to reduce current symptoms to base line and improve the patient's overall level of functioning. Continue Seroquel 300 mg at bedtime and 25 mg TID for improved mood stability/psychosis, Start Celexa 10 mg daily for depression. May benefit staring Paxil for multiple symptoms listed above including MDD, GAD, panic attacks, and insomnia.  Will start Vistaril 25mg  1 tablet po QHS prn for sleep.  3. Treat health problems as indicated. Start Sudafed prn symptoms of congestion, Mucinex prn cough/mucus production, Claritin 10 mg hs for congestion.  4. Develop treatment plan to decrease risk of relapse upon discharge and to reduce the need for readmission. 5. Psycho-social education regarding relapse prevention and self care. 6. Health care follow up as needed for medical problems. Continue Metrogel BID for bacterial vaginal infection.  7. Restart home medications where appropriate. 8. Disposition plans are in progress and discharged planning will be discussed on Monday morning based on her clinical improvement during this weekend.  Medical Decision Making Problem Points:  Established problem, stable/improving (1), Review of last therapy  session (1) and Review of psycho-social stressors (1) Data Points:  Review or order clinical lab tests (1) Review or order medicine tests (1) Review of medication regiment & side effects (2) Review of new medications or change in dosage (2)  I certify that inpatient services furnished can reasonably be expected to improve the patient's condition.   Juel Burrow Starkes FNP-BC  02/12/2014, 9:40 AM

## 2014-02-12 NOTE — [facility] Group Notes (Signed)
[facility] LCSW Group Therapy Note   02/12/2014  1:15 to 2:05 PM  Type of Therapy and Topic: Group Therapy: Feelings Around Returning Home & Establishing a Supportive Framework and Activity to Identify signs of Improvement or Decompensation   Participation Level: Active  Mood:  Appropriate yet attention seeking  Description of Group:  Patients first processed thoughts and feelings about up coming discharge. These included fears of upcoming changes, lack of change, new living environments, judgements and expectations from others and overall stigma of MH issues. We then discussed what is a supportive framework? What does it look like feel like and how do I discern it from and unhealthy non-supportive network? Learn how to cope when supports are not helpful and don't support you. Discuss what to do when your family/friends are not supportive.   Therapeutic Goals Addressed in Processing Group:  1. Patient will identify one healthy supportive network that they can use at discharge. 2. Patient will identify one factor of a supportive framework and how to tell it from an unhealthy network. 3. Patient able to identify one coping skill to use when they do not have positive supports from others. 4. Patient will demonstrate ability to communicate their needs through discussion and/or role plays.  Summary of Patient Progress:  Pt engaged easily during group session yet was often intrusive as other patients processed their anxiety about discharge and described healthy supports. Patient responded well to redirection. She shared her family is her support system and described honest relationship she has with her GM who is a Education officer, environmentalpastor. Patient shared that having a place to refocus has been helpful.   Carney Bernatherine C Harrill, LCSW

## 2014-02-12 NOTE — Progress Notes (Signed)
Adult Psychoeducational Group Note  Date:  02/12/2014 Time:  10:42 PM  Group Topic/Focus:  Wrap-Up Group:   The focus of this group is to help patients review their daily goal of treatment and discuss progress on daily workbooks.  Participation Level:  Active  Participation Quality:  Appropriate  Affect:  Appropriate  Cognitive:  Appropriate  Insight: Appropriate and Good  Engagement in Group:  Engaged  Modes of Intervention:  Clarification, Discussion and Exploration  Additional Comments:  Pt participated in group with MHT. Pt discussed the reason for her admission and how she feels better. Pt stated, today has been a good day. Pt currently denies SI/HI.   Claire FusiRobert Taylor [name] 02/12/2014, 10:42 PM

## 2014-02-12 NOTE — Progress Notes (Signed)
Psychoeducational Group Note  Date:  02/12/2014 Time:  1015  Group Topic/Focus:  Making Healthy Choices:   The focus of this group is to help patients identify negative/unhealthy choices they were using prior to admission and identify positive/healthier coping strategies to replace them upon discharge.  Participation Level:  Active  Participation Quality:  Appropriate  Affect:  Appropriate  Cognitive:  Oriented  Insight:  Improving  Engagement in Group:  Engaged  Additional Comments:  Pt engaged in the group   Laurita Peron A 02/12/2014 

## 2014-02-12 NOTE — Progress Notes (Signed)
D) Pt has been attending the program and interacts with her peers. Mood and affect are appropriate. Rates her depression and hopelessness both at an 8. Denies SI and HI. Continues to have cold symptoms, but less so than yesterday. Pt comes to the medication room frequently to get a cough drop for her sore throat. Has been supportive of her peers. States overall that she is feeling better. Requested tylenol this morning for cramps. States she is feeling better overall. A) Given support, reassurance, praise along with encouragement. Provided with a 1:1. Providing Pt with warm salt water to gargle with.  R) Denies SI and HI.

## 2014-02-13 DIAGNOSIS — F3130 Bipolar disorder, current episode depressed, mild or moderate severity, unspecified: Secondary | ICD-10-CM | POA: Diagnosis present

## 2014-02-13 MED ORDER — CLONAZEPAM 0.5 MG PO TABS: 0.5000 mg | ORAL_TABLET | Freq: Two times a day (BID) | ORAL | Status: DC

## 2014-02-13 MED ORDER — QUETIAPINE FUMARATE 25 MG PO TABS: 25.0000 mg | ORAL_TABLET | Freq: Three times a day (TID) | ORAL | Status: DC

## 2014-02-13 MED ORDER — HYDROXYZINE HCL 25 MG PO TABS: 25.0000 mg | ORAL_TABLET | Freq: Four times a day (QID) | ORAL | Status: DC | PRN

## 2014-02-13 MED ORDER — CITALOPRAM HYDROBROMIDE 10 MG PO TABS: 10.0000 mg | ORAL_TABLET | Freq: Every day | ORAL | Status: DC

## 2014-02-13 MED ORDER — QUETIAPINE FUMARATE 300 MG PO TABS: 300.0000 mg | ORAL_TABLET | Freq: Every day | ORAL | Status: DC

## 2014-02-13 NOTE — Progress Notes (Signed)
Writer has observed patient sitting in the hallway laughing and talking with select peers. She requested motrin for back pain and had received tylenol shortly before shift change for abdominal cramping. Her reported pain level does not match up with her activeness on the unit. She attended group and participated. She is compliant with her medications and currently denies si/hi/a/v hallucinations. Safety maintained on unit with 15 min checks.
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
## 2014-02-13 NOTE — [facility] Group Notes (Signed)
[facility] LCSW Group Therapy  02/13/2014   1:15 PM   Type of Therapy:  Group Therapy  Participation Level:   Minimal  Participation Quality:  Attentive but Minimal  Affect:  Depressed, Irritable  Cognitive:  Alert and Oriented  Insight:  Developing/Improving and Engaged  Engagement in Therapy:  Developing/Improving and Engaged  Modes of Intervention:  Clarification, Confrontation, Discussion, Education, Exploration, Limit-setting, Orientation, Problem-solving, Rapport Building, Dance movement psychotherapisteality Testing, Socialization and Support  Summary of Progress/Problems: Pt identified obstacles faced currently and processed barriers involved in overcoming these obstacles. Pt identified steps necessary for overcoming these obstacles and explored motivation (internal and external) for facing these difficulties head on. Pt further identified one area of concern in their lives and chose a goal to focus on for today.  Pt states that she is listening to group but has nothing to say.  Pt appears irritable but didn't want to share any further on what is bothering her.    Claire IvanChelsea Horton, LCSW 02/13/2014  2:17 PM

## 2014-02-13 NOTE — Tx Team (Signed)
Interdisciplinary Treatment Plan Update (Adult)  Date: 02/13/2014  Time Reviewed:  9:45 AM  Progress in Treatment: Attending groups: Yes Participating in groups:  Yes Taking medication as prescribed:  Yes Tolerating medication:  Yes Family/Significant othe contact made: Yes, with pt's grandmother Patient understands diagnosis:  Yes Discussing patient identified problems/goals with staff:  Yes Medical problems stabilized or resolved:  Yes Denies suicidal/homicidal ideation: Yes Issues/concerns per patient self-inventory:  Yes Other:  New problem(s) identified: N/A  Discharge Plan or Barriers: Pt [facility] for [facility] medication management and therapy.    Reason for Continuation of Hospitalization: Stable to d/c today  Comments: N/A  Estimated length of stay: D/C today  For review of initial/current patient goals, please see plan of care.  Attendees: Patient:  Claire HarbourCeletha Linsey  02/13/2014 10:35 AM   Family:     Physician:     Nursing:   Marzetta Boardhrista Dopson, RN 02/13/2014 10:24 AM   Clinical Social Worker:  Reyes Ivanhelsea Horton, LCSW 02/13/2014 10:24 AM   Other: Verne SpurrNeil Mashburn, PA 02/13/2014 10:24 AM   Other:  Quintella ReichertBeverly Knight, RN 02/13/2014 10:24 AM   Other:  Neill Loftarol Davis, RN 02/13/2014 10:24 AM   Other:  Onnie BoerJennifer Clark, UR case manager 02/13/2014 10:25 AM   Other:    Other:    Other:    Other:    Other:    Other:     Scribe for Treatment Team:   Carmina MillerHorton, Lunell Robart Nicole, 02/13/2014 10:24 AM

## 2014-02-13 NOTE — Progress Notes (Signed)
[facility] :  Will you be returning to the same living situation after discharge: Yes,  [place] At discharge, do you have transportation home?:Yes,  family will pick pt up Do you have the ability to pay for your medications:Yes,  provided pt with prescriptions and pt verbalizes ability to afford meds  Release of information consent forms completed and in the chart;  Patient's signature needed at discharge.  Patient to Follow up at: Follow-up Information   [facility] On 02/15/2014. (Please go to [facility] on Wednesday, Feb 15, 2014 or any weekkday between 8AM - 5PM for medication management and counseling)    Contact information:   [address] [place], [place]   [id number]  [phone]      Patient denies SI/HI:   Yes,  denies SI/HI    Safety Planning and Suicide Prevention discussed:  Yes,  discussed with pt and pt's grandmother.  See suicide prevention education note.   Collan Schoenfeld N Horton 02/13/2014, 10:59 AM

## 2014-02-13 NOTE — [facility] Suicide Risk Assessment (Signed)
Suicide Risk Assessment  Discharge Assessment     Demographic Factors:  Adolescent or young adult  Total Time spent with patient: 45 minutes  Psychiatric Specialty Exam:     Blood pressure 135/86, pulse 102, temperature 97.7 F (36.5 C), temperature source Oral, resp. rate 18, height 5' 4.57" (1.64 m), weight 106.142 kg (234 lb).Body mass index is 39.46 kg/(m^2).  General Appearance: Fairly Groomed  Patent attorneyye Contact::  Fair  Speech:  Clear and Coherent  Volume:  Normal  Mood:  worried  Affect:  Appropriate  Thought Process:  Coherent and Goal Directed  Orientation:  Full (Time, Place, and Person)  Thought Content:  plans as she moves on, improved coping skills  Suicidal Thoughts:  No  Homicidal Thoughts:  No  Memory:  Immediate;   Fair Recent;   Fair Remote;   Fair  Judgement:  Fair  Insight:  Present  Psychomotor Activity:  Normal  Concentration:  Fair  Recall:  FiservFair  Fund of Knowledge:NA  Language: Fair  Akathisia:  No  Handed:    AIMS (if indicated):     Assets:  Desire for Improvement Housing  Sleep:  Number of Hours: 5.75    Musculoskeletal: Strength & Muscle Tone: within normal limits Gait & Station: normal Patient leans: N/A   Mental Status Per Nursing Assessment::   On Admission:  Suicidal ideation indicated by patient;Suicide plan;Plan includes specific time, place, or method;Self-harm behaviors  Current Mental Status by Physician: In full contact with reality. There are no active SI plans or intent. She is insightful. She is aware of the events that triggered this decompensation and has a plan of how to address them. Aware of multiple issues since the post partum depression that she has not dealt with. She has worked on herself on her perceptions and on improved coping skills. statets that the medications she felt quit working so she D/C them. She plans to comply with this regime   Loss Factors: NA  Historical Factors: NA  Risk Reduction Factors:    Responsible for children under 23 years of age, Sense of responsibility to family and Living with another person, especially a relative  Continued Clinical Symptoms:  Bipolar Disorder:   Depressive phase  Cognitive Features That Contribute To Risk:  Closed-mindedness Polarized thinking Thought constriction (tunnel vision)    Suicide Risk:  Minimal: No identifiable suicidal ideation.  Patients presenting with no risk factors but with morbid ruminations; may be classified as minimal risk based on the severity of the depressive symptoms  Discharge Diagnoses:   AXIS I:  Bipolar, Depressed AXIS II:  No diagnosis AXIS III:   Past Medical History  Diagnosis Date  . Depression   . Anxiety   . H/O umbilical hernia repair    AXIS IV:  other psychosocial or environmental problems AXIS V:  61-70 mild symptoms  Plan Of Care/Follow-up recommendations:  Activity:  as tolerated Diet:  regular Follow up outpatient basis Is patient on multiple antipsychotic therapies at discharge:  No   Has Patient had three or more failed trials of antipsychotic monotherapy by history:  No  Recommended Plan for Multiple Antipsychotic Therapies: NA    Claire Taylor 02/13/2014, 3:00 PM

## 2014-02-13 NOTE — [facility] Group Notes (Signed)
[facility]   02/13/2014 8:45 AM  Participation Quality:  Alert, Appropriate and Oriented  Mood/Affect:  Calm  Depression Rating:  6  Anxiety Rating:  6  Thoughts of Suicide:  Pt denies SI/HI  Will you contract for safety?   Yes  Current AVH:  Pt denies  Plan for Discharge/Comments:  Pt attended discharge planning group and actively participated in group.  CSW provided pt with today's workbook.  Pt reports feeling ready to d/c.  Pt states that [place] support and has [facility] management.  No further needs voiced by pt at this time.    Transportation Means: Pt reports access to transportation - family will pick pt up  Supports: Family is supportive  Reyes IvanChelsea Horton, LCSW 02/13/2014 9:30 AM

## 2014-02-13 NOTE — Progress Notes (Signed)
Patient ID: Claire HarbourCeletha Taylor, female   DOB: 02/04/1991, 23 y.o.   MRN: [id number]  Pt. Denies SI/HI and A/V hallucinations. Patient rates her depression at 4/10 and her hopelessness at 0/10. Belongings returned to patient at time of discharge. Patient denies any pain or discomfort. Discharge instructions and medications were reviewed with patient. Patient verbalized understanding of both medications and discharge instructions. Q15 minute safety checks until discharge. No distress upon discharge.

## 2014-02-16 NOTE — Progress Notes (Signed)
Patient Discharge Instructions:  After Visit Summary (AVS):   Faxed to:  02/16/14 Discharge Summary Note:   Faxed to:  02/16/14 Psychiatric Admission Assessment Note:   Faxed to:  02/16/14 Suicide Risk Assessment - Discharge Assessment:   Faxed to:  02/16/14 Faxed/Sent to the Next Level Care provider:  02/16/14 [facility] to [facility] @ [phone]  Jerelene ReddenSheena E Nara Visa, 02/16/2014, 3:51 PM

## 2014-04-25 IMAGING — US US PELV - US TRANSVAGINAL
1 series · 14 of 25 positions shown · non-contrast
Comparison: none

REASON FOR EXAM: check correct IUD placement plane and position.
COMMENTS:

[Series 1: us pelv - us transvaginal · 0.28mm/px · 14 of 117 slices shown]
[im 1/117]
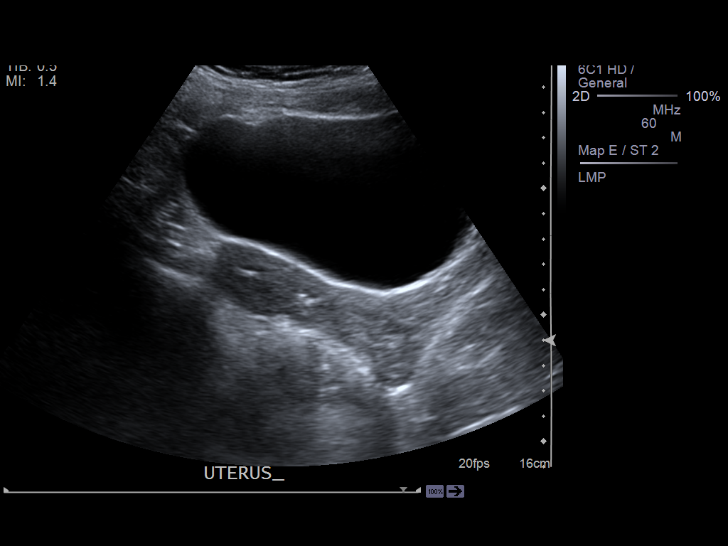
[im 10/117]
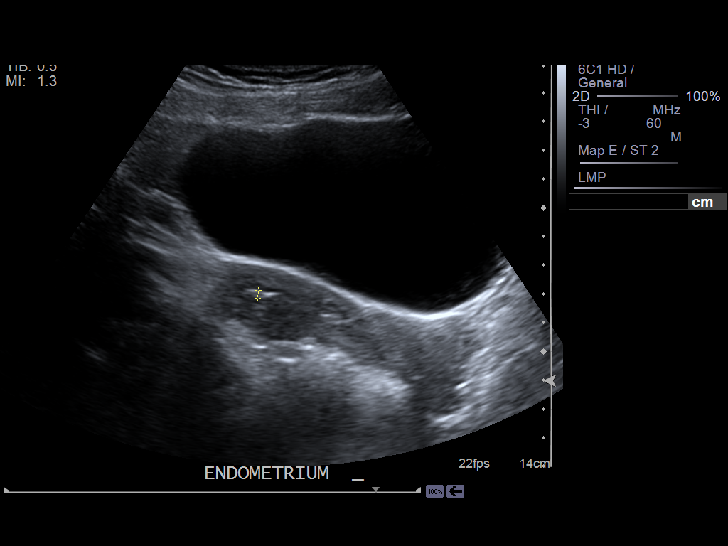
[im 20/117]
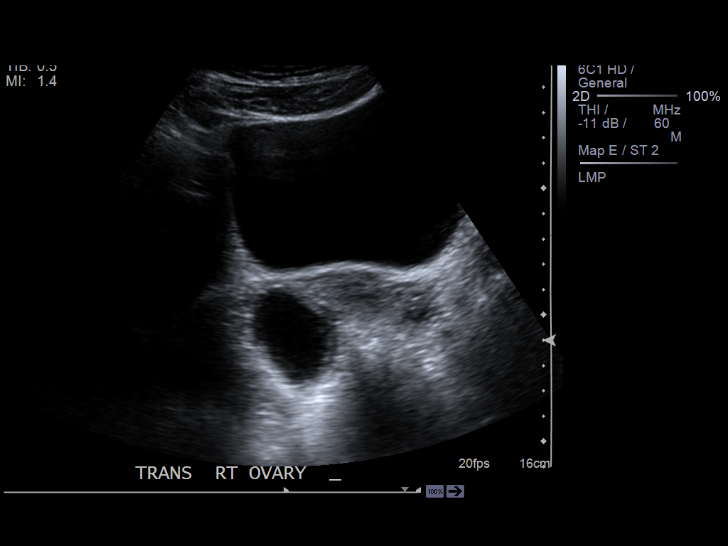
[im 30/117]
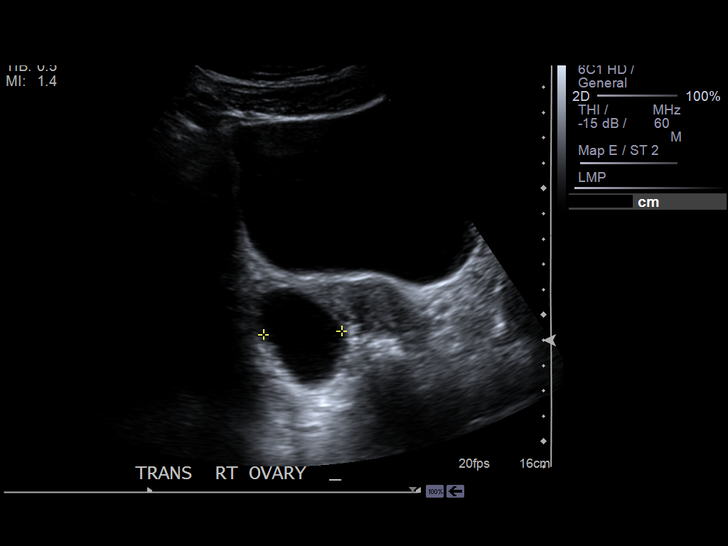
[im 39/117]
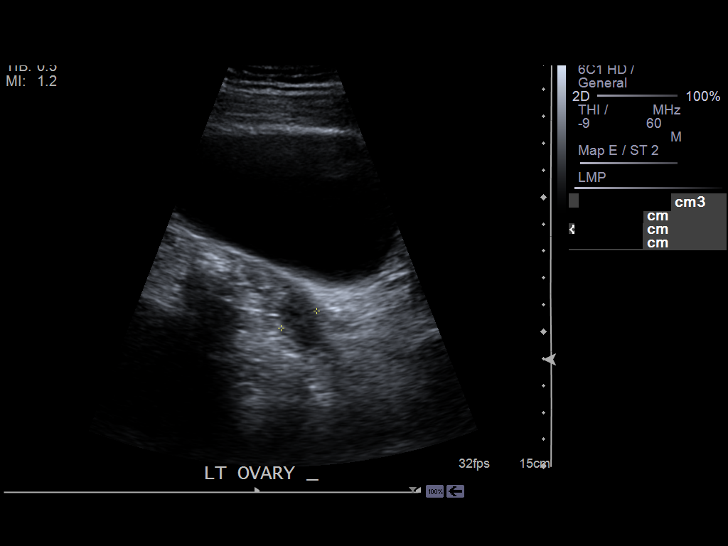
[im 44/117]
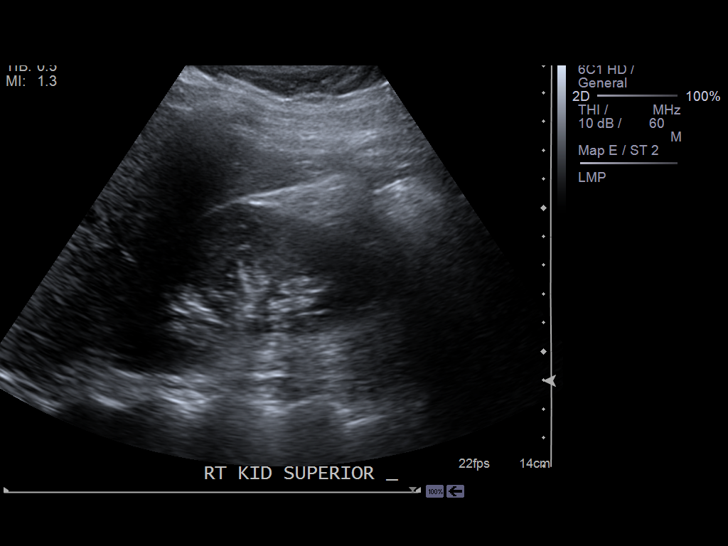
[im 54/117]
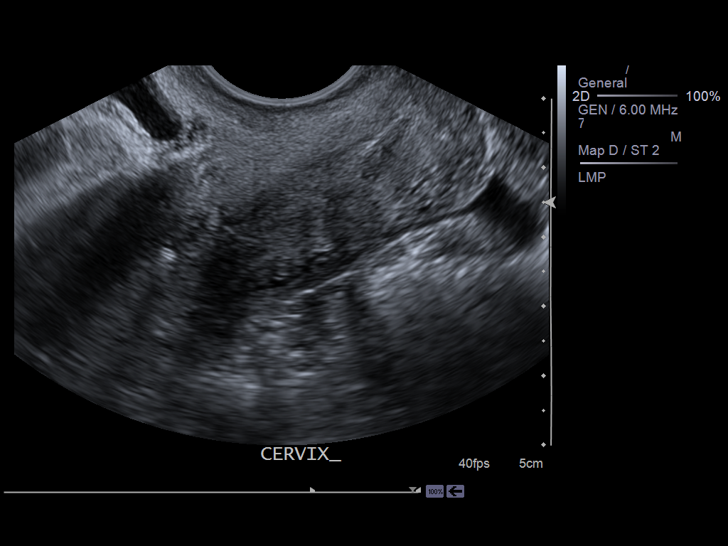
[im 63/117]
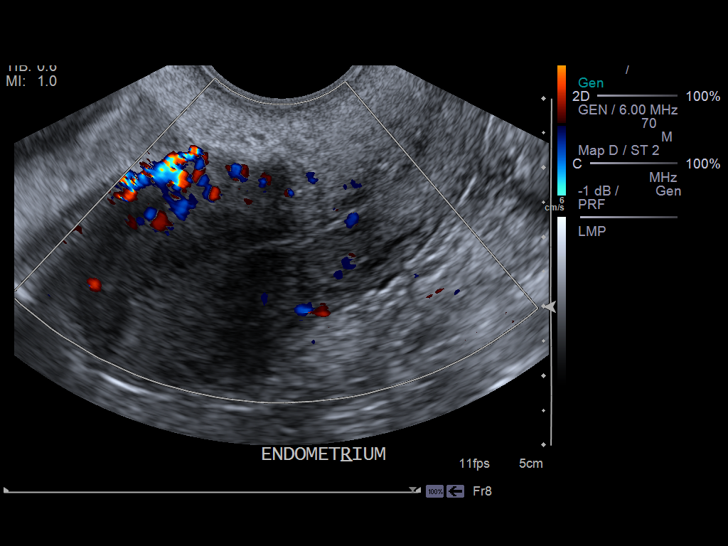
[im 73/117]
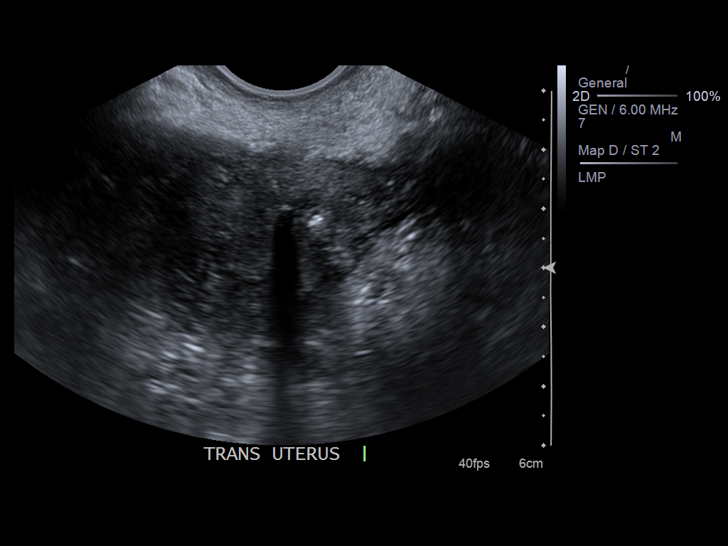
[im 78/117]
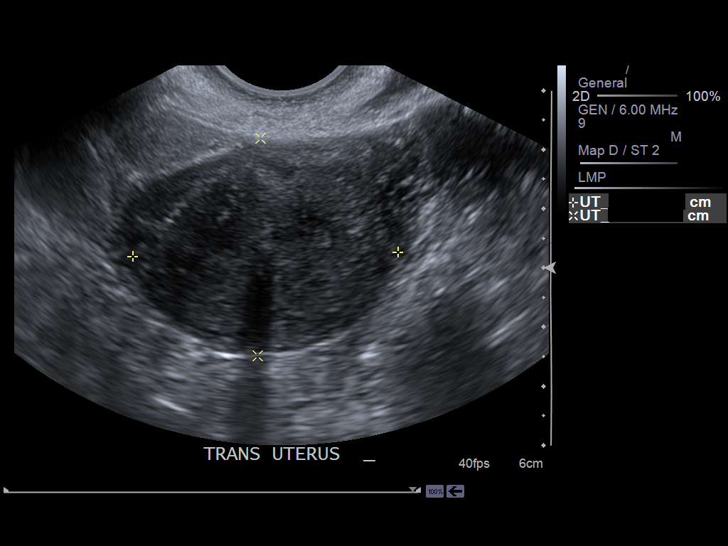
[im 88/117]
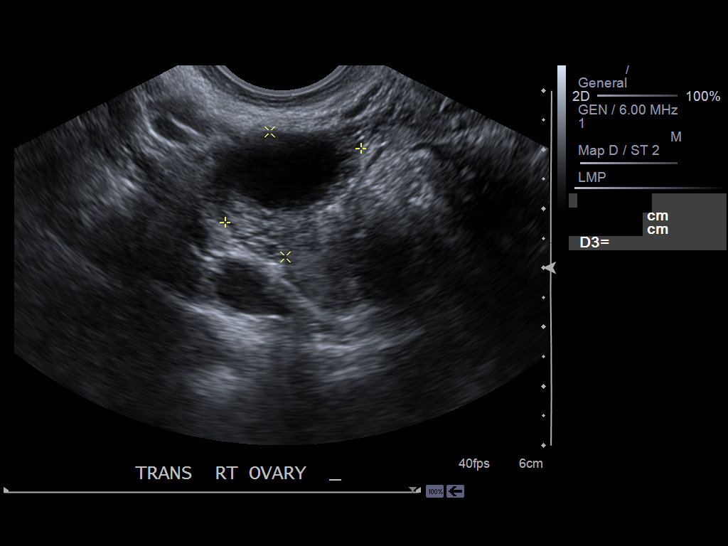
[im 97/117]
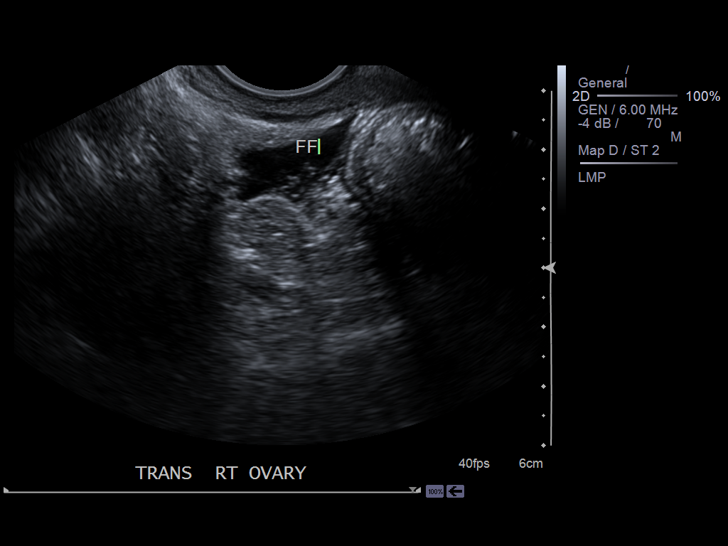
[im 107/117]
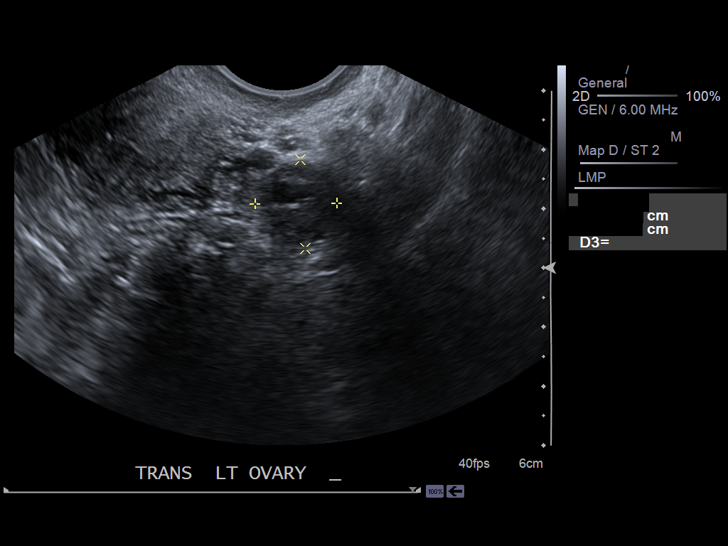
[im 117/117]
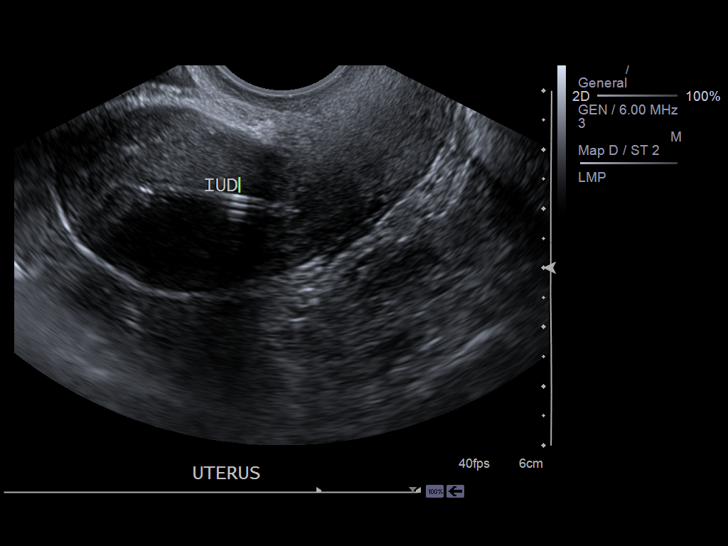

[14 of 25 positions shown; findings below may reference images not displayed]

PROCEDURE:     US  - US PELVIS EXAM W/TRANSVAGINAL  - February 11, 2013  [DATE]

RESULT:     Transabdominal and endovaginal pelvic sonogram is performed.
Uterus measures 8.17 x 4.48 x 3.68 cm on endovaginal images. The endometrial
stripe thickness is 5.1 mm. There is an intrauterine device in place in the
fundal region and upper portion of the uterus. And kidneys appear normal.
The endovaginal images show a small amount of fluid in the rectouterine
cul-de-sac. Two cysts are present in the right ovary. The largest measures
4.47 x 2.16 x 3.15 cm. A smaller fluid collection is not measured. The left
ovary measures 1.38 x 1.50 x 2.44 cm.
IMPRESSION: Intrauterine device appears in good location.
2. Large cystic mass arising from the right ovary measuring is much as
cm with a smaller cyst present. Doppler flow is seen in both ovaries. Small
amount of free fluid demonstrated in the rectouterine cul-de-sac.

[REDACTED]

## 2014-09-14 LAB — URINALYSIS, COMPLETE
BACTERIA: NONE SEEN
Bilirubin,UR: NEGATIVE
Blood: NEGATIVE
Glucose,UR: NEGATIVE mg/dL (ref 0–75)
KETONE: NEGATIVE
Leukocyte Esterase: NEGATIVE
NITRITE: NEGATIVE
Ph: 6 (ref 4.5–8.0)
RBC,UR: 2 /HPF (ref 0–5)
Specific Gravity: 1.011 (ref 1.003–1.030)
Squamous Epithelial: 1
WBC UR: 1 /HPF (ref 0–5)

## 2014-09-14 LAB — ETHANOL

## 2014-09-14 LAB — COMPREHENSIVE METABOLIC PANEL
ALBUMIN: 4 g/dL (ref 3.4–5.0)
ALT: 27 U/L
ANION GAP: 8 (ref 7–16)
AST: 34 U/L (ref 15–37)
Alkaline Phosphatase: 107 U/L
BUN: 6 mg/dL — ABNORMAL LOW (ref 7–18)
Bilirubin,Total: 0.3 mg/dL (ref 0.2–1.0)
CHLORIDE: 108 mmol/L — AB (ref 98–107)
CO2: 23 mmol/L (ref 21–32)
Calcium, Total: 9.4 mg/dL (ref 8.5–10.1)
Creatinine: 0.9 mg/dL (ref 0.60–1.30)
EGFR (African American): >60
EGFR (Non-African Amer.): >60
GLUCOSE: 115 mg/dL — AB (ref 65–99)
Osmolality: 276 (ref 275–301)
POTASSIUM: 3.6 mmol/L (ref 3.5–5.1)
Sodium: 139 mmol/L (ref 136–145)
Total Protein: 8.3 g/dL — ABNORMAL HIGH (ref 6.4–8.2)

## 2014-09-14 LAB — SALICYLATE LEVEL: SALICYLATES, SERUM: 3.1 mg/dL — AB

## 2014-09-14 LAB — DRUG SCREEN, URINE
Amphetamines, Ur Screen: NEGATIVE (ref ?–1000)
BARBITURATES, UR SCREEN: NEGATIVE (ref ?–200)
Benzodiazepine, Ur Scrn: NEGATIVE (ref ?–200)
CANNABINOID 50 NG, UR ~~LOC~~: POSITIVE (ref ?–50)
Cocaine Metabolite,Ur ~~LOC~~: NEGATIVE (ref ?–300)
MDMA (Ecstasy)Ur Screen: NEGATIVE (ref ?–500)
Methadone, Ur Screen: NEGATIVE (ref ?–300)
OPIATE, UR SCREEN: NEGATIVE (ref ?–300)
Phencyclidine (PCP) Ur S: NEGATIVE (ref ?–25)
Tricyclic, Ur Screen: NEGATIVE (ref ?–1000)

## 2014-09-14 LAB — CBC
HCT: 41 % (ref 35.0–47.0)
HGB: 13.2 g/dL (ref 12.0–16.0)
MCH: 29 pg (ref 26.0–34.0)
MCHC: 32.3 g/dL (ref 32.0–36.0)
MCV: 90 fL (ref 80–100)
PLATELETS: 360 10*3/uL (ref 150–440)
RBC: 4.55 10*6/uL (ref 3.80–5.20)
RDW: 13.9 % (ref 11.5–14.5)
WBC: 12.2 10*3/uL — AB (ref 3.6–11.0)

## 2014-09-14 LAB — ACETAMINOPHEN LEVEL

## 2014-09-19 ENCOUNTER — Inpatient Hospital Stay: Payer: Self-pay | Admitting: Psychiatry

## 2014-09-20 LAB — VALPROIC ACID LEVEL: Valproic Acid: 94 ug/mL

## 2014-12-14 ENCOUNTER — Ambulatory Visit: Payer: Self-pay | Admitting: Anesthesiology

## 2015-01-16 NOTE — Op Note (Signed)
PATIENT NAME:  Claire Taylor, Claire Taylor MR#:  [id number] DATE OF BIRTH:  April 26, 1991  DATE OF PROCEDURE:  04/21/2012  PREOPERATIVE DIAGNOSIS: Fetal intolerance to labor.   POSTOPERATIVE DIAGNOSIS: Fetal intolerance to labor.  PROCEDURE: Primary low transverse cesarean section.   SURGEON: Senaida LangeLashawn Weaver-Lee, MD   ASSISTANT: Shella Maximarcia Putnam, CNM   ESTIMATED BLOOD LOSS: 500 mL.  OPERATIVE FLUIDS: 600 mL.   COMPLICATIONS: None.   FINDINGS: Vertex female infant, thick meconium staining, 2830 grams, Apgars 8 and 9, normal uterus, tubes, and ovaries.   SPECIMEN: Cord blood and gas.   INDICATIONS: The patient is a [age] year old who presents in early labor who was augmented with Pitocin. The patient progressed in labor, however, began to have nonreassuring fetal testing. Decision was made to proceed towards abdominal delivery. The risks, benefits, indications, and alternatives of the procedure were explained and informed consent was obtained.   PROCEDURE: The patient was taken to the Operating Room with IV fluids running. She was prepped and draped in the usual sterile fashion with a leftward tilt. A Pfannenstiel skin incision was made and carried down to underlying fascia with the knife. The fascia was nicked in the midline. The incision was extended laterally. The superior aspect of the fascia was grasped with Kocher's and the underlying rectus muscles were dissected off. This was repeated on the inferior fascia. The rectus muscles were divided in the midline, and the peritoneum was entered bluntly. The opening was extended. A bladder blade was placed. The vesicouterine peritoneum was grasped with pick-ups and entered sharply with the Metzenbaum's. The bladder flap was created digitally. The bladder blade was then replaced. The hysterotomy incision was made, carried down to underlying fetal membrane. The opening was extended. The infant's head was grasped and the membranes were ruptured using an Allis clamp. At  this point, thick meconium was encountered. The infant's head was delivered. The nuchal cord x2 was reduced. The anterior and posterior shoulder were delivered, followed by the remainder of the body. The cord was clamped x2 and cut. The infant was handed to the awaiting neonatologist. The placenta was expressed. The uterus was exteriorized and cleared of all clot and debris. The hysterotomy incision was repaired with #0 Monocryl in a running locked fashion. The uterus was returned to the abdomen. The abdomen and gutters were irrigated with copious amounts of warm normal saline. The peritoneum was repaired with a 2-0 Vicryl. The On-Q pump apparatus was placed according to manufacturer's instructions. After the catheters were primed, the fascia was closed with #1 PDS and the skin was closed with 4-0 Vicryl on a Keith needle. The above catheters were bolused with 5 mL of 0.5% bupivacaine, and the catheters were secured using Steri-Strips and Tegaderm. The patient tolerated procedure well. Sponge, needle and instrument counts were correct x2. The patient was taken to the recovery room in stable condition.    ____________________________ Sonda PrimesLashawn A. Patton SallesWeaver-Lee, MD law:cbb D: 04/21/2012 19:15:35 ET T: 04/22/2012 09:52:53 ET JOB#: [id number]  cc: Flint MelterLashawn A. Patton SallesWeaver-Lee, MD, <Dictator> Sonda PrimesLASHAWN A WEAVER LEE MD ELECTRONICALLY SIGNED 05/14/2012 10:29

## 2015-01-19 NOTE — Discharge Summary (Signed)
PATIENT NAME:  Rip HarbourHALL, Claire Taylor DATE OF BIRTH:  [birth year]  DATE OF ADMISSION:  02/08/2013 DATE OF DISCHARGE:  02/16/2013  HOSPITAL COURSE: See dictated history and physical for details of admission. A 24 year old woman with a history of bipolar disorder with manic features and psychotic symptoms, came back into the hospital shortly after discharge. She was slightly more manic, but seems to have had a bad experience with her return to staying with the family. The patient's chief complaint when she came back was that she wanted to learn how to be a better mother. She was agitated and often focused on her somatic symptoms but by and large, she was cooperative with medication treatment. She did not display any suicidal behavior or make any suicidal threats. She was cooperative. She was not nearly as paranoid and agitated as she was on her previous admission. By the time of discharge she was still slightly irritable, but not threatening and overall mentally lucid. A family member had come through with what seemed like a safer place for her to stay and the patient felt comfortable with discharge. There was no sign of acute dangerousness. She was tolerating medicine well and agreed to continue it outside the hospital. The patient was educated extensively about the importance of staying on her medicine, staying involved in treatment and staying off of substances of abuse, and she agrees to the plan.   DISCHARGE MEDICATIONS: Naprosyn 500 mg twice a day, Percocet 5 mg 1 tablet every 4 hours as needed for pain with a limited supply given (this is for what appears to probably be an ovarian cyst), fluoxetine 10 mg once a day, loratadine 10 mg at night, quetiapine 25 mg 3 times a day and 300 mg 2 of them at night for a total of 600 mg at night, clonazepam 0.5 mg twice a day, docusate 100 mg twice a day, orphenadrine 100 mg twice a day as needed for muscle spasm.   LABORATORY RESULTS: Admission labs included  a CBC that was all normal, chemistry that was normal except for slightly elevated glucose at 113 on a nonfasting draw. TSH normal. Alcohol not detected. Urinalysis borderline but probably not infected. Drug screen positive for cannabis. Pelvic ultrasound was done because of her complaints of persistent pelvic pain. The ultrasound showed a cystic mass on the right ovary with a smaller cyst present as well. She was seen by OB/GYN in consult and they recommended pain medicine as well as well outpatient followup after discharge.   FOLLOWUP AND DISPOSITION: Discharged to the home with a family member. [facility]   MENTAL STATUS EXAMINATION AT DISCHARGE: Neatly dressed and groomed. Good eye contact. Normal psychomotor activity. Speech normal in rate, tone and volume. Affect slightly irritable, but reactive and appropriate overall. Mood stated as okay. Thoughts are generally lucid. No obvious loosening of associations or bizarre thinking. Denies delusions. Denies hallucinations. Denies suicidal or homicidal ideation. Able to speak at a normal rate and engage in appropriate conversation. Shows improved judgment and insight. Normal intelligence.   DIAGNOSIS, PRINCIPAL AND PRIMARY:  AXIS I: Bipolar disorder type I, manic.   SECONDARY DIAGNOSES: AXIS I: Cannabis abuse.  AXIS II: No diagnosis.  AXIS III: Ovarian cysts.  AXIS IV: Moderate to severe from dislocation, poor social support.  AXIS V: Functioning at time of discharge: 55.    ____________________________ Audery AmelJohn T. Damaris Abeln, MD jtc:jm D: 03/05/2013 14:45:54 ET T: 03/05/2013 15:40:43 ET JOB#: [id number]  cc: Audery AmelJohn T. Alyda Megna,  MD, <Dictator> Audery Amel MD ELECTRONICALLY SIGNED 03/05/2013 17:21

## 2015-01-19 NOTE — H&P (Signed)
PATIENT NAME:  Claire Taylor, Aneyah MR#:  [id number] DATE OF BIRTH:  20-Dec-1990  DATE OF ADMISSION:  01/25/2013  DATE OF EVALUATION: 01/26/2013.   IDENTIFYING INFORMATION AND CHIEF COMPLAINT: A 24 year old woman with unknown past history presents to the hospital in a state of psychosis.   CHIEF COMPLAINT:  "I'm not playing with you."   HISTORY OF PRESENT ILLNESS:  Information obtained from the patient and from the chart. The patient presented herself to the Emergency Room and stated that she felt like she was being manipulated and played with. She said that she felt like "narcotics" had messed her up. By narcotics, she meant Seroquel and Prozac. She otherwise made a lot of confused statements that did not make any sense. On interview today, the patient still does not make much sense. She immediately approaches the conversation with a paranoid slant stating that we are manipulating her and playing with her and that she is not going to play with us. She refuses to answer most questions directly. Implies frequently that I know the answers to the questions that I am asking. Makes hostile statements on several occasions. She tells me that she just wants to go take care of her daughter. Will not be any more clear than that. Will not answer questions about substance abuse. Does say that she has been taking the medicines she has been prescribed, which are Prozac 20 mg a day and Seroquel 50 mg a day, as well as clonazepam which appears to be at a dose of 0.5 mg p.r.n.  The patient did not state any particular recent stressor that has happened.   PAST PSYCHIATRIC HISTORY: The patient presented to our hospital one time previously several years ago in a state of being tearful and upset with the possibility that she might have cut herself, but ultimately nothing seems to have come of it and she was not admitted to the hospital. She tells me that her current medications are being prescribed by her primary care doctor. She  apparently has never had any psychiatric hospitalizations. Will not answer questions about suicide attempts or violence. Will not answer questions about any other medication she has taken.   SUBSTANCE ABUSE HISTORY:  The patient will not answer direct questions about this. She does have a positive drug screen for cannabis and opiates.   SOCIAL HISTORY:  She indicates to me that she is living with her mother and her infant daughter. It is not clear whether she is working on what other family members she has involved in her life.   CURRENT MEDICATIONS: Apparently Prozac 10 mg per day, Seroquel 50 mg per day and Klonopin 0.5 mg twice a day as needed.   ALLERGIES:  MYLANTA, RELPAX AND BANANA.   REVIEW OF SYSTEMS:  The patient feels like she is being manipulated. Feels paranoid. Feels very tired today. Denies suicidal or homicidal ideation. Will not report answers to most other questions. Does not report any other physical symptoms.   MENTAL STATUS EXAMINATION:  Adequately groomed woman who looks her stated age. Initially she was at least somewhat cooperative but quickly became hostile and agitated and then terminated the interview. Eye contact was intermittent. Psychomotor activity was agitated with lots of angry hand gestures. Affect labile and at times hostile and angry. Mood stated as being pissed off. Thoughts were very disorganized and paranoid. Impossible to really follow her train of thought. Possibly responding to internal stimuli.  Would not answer questions about hallucinations. Denies current suicidal or homicidal ideation.  Shows impaired judgment and insight, probably normal baseline intelligence.   PHYSICAL EXAMINATION:  Full physical was impaired by her lack of cooperation. There were no skin lesions identified to general observation. Face appeared to be symmetric. She appeared to have full range of motion at all extremities and a normal gait. Cranial nerves appeared to be symmetric and  normal. The patient was not labored in her breathing, did not seem to be in any pain.  CURRENT VITAL SIGNS:  Temperature 98, pulse 70, respirations 20, blood pressure 150/80.   LABORATORY RESULTS:  Drug screen positive for cannabis and opiates. Urinalysis normal. TSH normal. Alcohol, low at 14 but not undetected. Potassium slightly low at 3.2. White count slightly elevated at 13.4.   ASSESSMENT:  A 24 year old woman who presents psychotic, paranoid, delusional, possibly manic. Unclear past history. Wide differential diagnosis including substance-induced, medication-induced, medical-induced, as well as bipolar disorder or schizophrenia. The patient needs hospitalization for psychiatric stabilization and treatment.   TREATMENT PLAN:  I tried to call the phone numbers in the chart but no one answers. I will try and reach the primary care doctor she named, if possible. I have gone ahead and upped her Seroquel to 200 mg at night. Try to engage her in groups and activities. Monitor daily progress, see if we can get her psychosis improved.   DIAGNOSIS PRINCIPAL AND PRIMARY: AXIS I:  Bipolar disorder, psychotic, manic.  SECONDARY DIAGNOSES: AXIS I:  No further.  AXIS II:  No diagnosis.   AXIS III:  No diagnosis.   AXIS IV:  Unknown stress, probably at least moderate from being a single parent.   AXIS V:  Functioning at time of evaluation 30.   ____________________________ Audery Amel, MD jtc:ce D: 01/26/2013 17:36:48 ET T: 01/26/2013 17:56:00 ET JOB#: [id number]  cc: Audery Amel, MD, <Dictator> Audery Amel MD ELECTRONICALLY SIGNED 01/26/2013 18:52

## 2015-01-19 NOTE — Consult Note (Signed)
PATIENT NAME:  Rip HarbourHALL, Claire MR#:  Taylor DATE OF BIRTH:  Claire Taylor  DATE OF CONSULTATION:  02/08/2013  REFERRING PHYSICIAN:  Dr. Manson PasseyBrown CONSULTING PHYSICIAN:  Ardeen FillersUzma S. Garnetta BuddyFaheem, MD  REASON FOR CONSULTATION: Manic behavior.   HISTORY OF PRESENT ILLNESS: The patient is a 24 year old single African-American female who was recently discharged from the inpatient  Behavioral Health Unit for complaining that she needs to be a better mother for her child.  The patient was admitted to the inpatient Behavioral Health Unit on 01/25/2013 and was recently discharged after being supplied with her psychotropic medication.  However, after her discharge, she again presented after 2 days and reported that she has not been taking her medications properly, and she is not used to them.  She reported that she went to her grandmother's house, and she cannot live there because she has trespassing charges. She reported that she decided to come back here so she can find a stable place for herself.  The patient reported that she also has a 313-month-old daughter who can live with her grandmother as she is trying to get her grandmother to get the power of attorney over herself as well as her baby. The patient reported that things are not going the way she wants them to go for her.  The patient reported that she has not been using any drugs or alcohol at this time, but she wants to go to a 90 day rehabilitation program.  The patient appeared very paranoid as well as playing with her hair during the interview.  She refuses to answer most of the questions directly.  She was making comments that she is not doing well on her medications and was taking Klonopin, Ativan as well as pain medications as she is having some pain in her leg. She reported that she has stopped taking the Zyprexa as it was making her very tired and sleepy.  She reported that she wants her medications to be adjusted at this time.  She was unable to contract for safety  and will be admitted again for stabilization and safety.    PAST PSYCHIATRIC HISTORY: The patient has history of a recent hospitalization and was discharged in a stable condition; however, she has history of paranoia and remains manic at times.  Her current psychotropic medications were prescribed by her primary care doctor. She does not have any history of suicide attempts or violence in the past.    SUBSTANCE ABUSE HISTORY:  The patient denied any history of substance abuse in the past.  Her urine drug screen is currently positive for cannabinoids at this time.    SOCIAL HISTORY: The patient reported that she went to stay with her grandmother, but she has trespassing charges.  She used to live with her mother and her 663-month-old daughter.   CURRENT MEDICATIONS: Seroquel and Prozac which were given to her at the time of discharge.   ALLERGIES:  MYLANTA, RELPAX AND BANANA.  VITAL SIGNS: Temperature 93.2, pulse 81, respirations 18, blood pressure 130/88.   LABORATORY AND RADIOLOGICAL DATA: Glucose 113, BUN 18, creatinine 0.9. Sodium 139, potassium 2.7, chloride 107, bicarbonate 23, anion gap 9, osmolality 280, calcium 9.5.  Blood alcohol level less than 3.0.  Protein 7.8, albumin 4.4, bilirubin 0.2, alkaline phosphatase 77, AST 27, ALT 21 and TSH 1.43.  Cannabinoids positive in the urine drug screen. WBC 8.7, RBC 4.29, hemoglobin 12.4, hematocrit 36.9, platelet count 215, MCV 86, MCA 23.6.   MENTAL STATUS EXAM: The patient is  a moderately-built female who appeared her stated age.  She was fixing her hair during the interview.  She maintained fair eye contact.  Her mood was anxious.  Affect was congruent.  Thought process tangential.  Thought content  was nondelusional.  She was unable to contract for safety.  She demonstrated poor Insight and judgment.    DIAGNOSTIC IMPRESSION:   AXIS I:  Bipolar disorder, most recent episode manic, severe, without psychotic features.   AXIS II:  None.   AXIS  III:  None.   AXIS IV:  Severe stress.    AXIS V:   Current Global Assessment of Functioning score is 25.   TREATMENT PLAN:  1.  The patient will be admitted to the inpatient Behavioral Health Unit for stabilization and safety.  2.  She will be started back on her medications at this time. 3.  Treatment team to follow her and to help her with group and milieu therapy. Thank you for allowing me to participate in the care of this patient.  ____________________________ Ardeen Fillers. Garnetta Buddy, MD usf:cb D: 02/08/2013 15:01:00 ET T: 02/08/2013 15:41:45 ET JOB#: [id number]  cc: Ardeen Fillers. Garnetta Buddy, MD, <Dictator> Rhunette Croft MD ELECTRONICALLY SIGNED 02/10/2013 13:46

## 2015-01-19 NOTE — H&P (Signed)
PATIENT NAME:  Claire Taylor, Claire Taylor MR#:  [id number] DATE OF BIRTH:  1991/08/13  DATE OF ADMISSION:  02/08/2013  DATE OF EVALUATION:  02/08/2013   IDENTIFYING INFORMATION AND CHIEF COMPLAINT: A 24 year old woman who was just discharged a few days ago from the inpatient psychiatry service. She returns stating, "I need to learn to be a better mother."   HISTORY OF PRESENT ILLNESS:  The patient had been admitted to our inpatient psychiatry service up until just a few days ago. During that hospitalization, she had displayed psychotic, paranoid ideation and behavior and manic-like symptoms. She had eventually shown some response to a medication combination of fairly high dose Zyprexa and clonazepam and Depakote and low-dose Prozac. After discharge from the hospital, she says that she went back to stay with her family and found "things were worse than I had thought." Her chief complaint is that her mother was wearing her clothing. Somehow this seems to have been a catastrophe for her. She also says that people there were dirty and not taking care of the house. Somehow this seems to have been a trigger for her to feel that she needed to come back to the hospital. She is reporting that her mood feels good, although it is a little bit irritated. She claims that she had been sleeping fine. She admits that she had used a little bit of marijuana, but no other drugs. She claims that she has been fully compliant with all of her medications. She is not currently reporting suicidal or homicidal ideation. She is denying any acute psychotic symptoms. She repeats several times that she thinks that by coming into the hospital she will "learn skills so that I can be a better mother."   PAST PSYCHIATRIC HISTORY:  The patient has a history of bipolar disorder, most recent episode psychotic. She was admitted to the hospital and treated for mania and psychosis and was only discharged on May 9th. She has 1 previous hospitalization before  that for mood instability as well. She has a history of abusing marijuana. No other serious ongoing substance abuse identified. She had appeared to eventually respond to a combination of Zyprexa, Depakote and clonazepam during the last hospitalization. The patient does have a history of self-injury and also has a history of aggression and fighting with others.   PAST MEDICAL HISTORY:  The patient has really no significant ongoing medical problems. She complains of chronic back pain.   SOCIAL HISTORY:  The patient has 1 child. The patient had recently been living with her mother and other extended members of the family. The relationship with the family at times seems to be chaotic and codependent. The family had been very displeased at times with her hospital treatment last time she was here for reasons that were never completely clear.   CURRENT MEDICATIONS:  Zyprexa 10 mg 3 times a day, clonazepam 0.5 mg twice a day, Depakote 250 mg twice a day, Prozac 10 mg per day, docusate 100 mg twice a day, Naprosyn 500 mg twice a day p.r.n.   ALLERGIES: MYLANTA, RELPAX AND BANANAS.   REVIEW OF SYSTEMS: The patient denies suicidal or homicidal ideation. Denies hallucinations. Says that her mood is feeling okay, just a little bit stressed. Denies any new acute physical symptoms. Really a rather negative review of systems.   MENTAL STATUS EXAMINATION:  Neatly dressed and groomed woman, looks her stated age. She actually is dressed and presenting herself more appropriately than she was on her previous hospitalization. Makes good  eye contact. Psychomotor activity is appropriate. The patient is cooperative with the interview and does not make the same sort of paranoid and threatening statements that she made often last time. She states that her mood is a little bit anxious, but not bad. Her affect seems slightly reserved and constricted. Thoughts appear to generally be lucid. She did not make any bizarre or paranoid  statements. She denied hallucinations. Denies suicidal or homicidal ideation. She still seems to have somewhat impaired insight regarding her illness, and judgment is not entirely clear. Intelligence normal.   PHYSICAL EXAMINATION: GENERAL:  The patient appears to be in no acute physical distress.  SKIN:  No skin lesions identified.  HEENT:  Pupils equal and reactive. Face symmetric.  NECK AND BACK:  Nontender with normal range of motion.  EXTREMITIES:  Full range of motion in all extremities.  NEUROLOGIC:  Gait within normal limits. Strength and reflexes symmetric throughout. Cranial nerves symmetric and normal.  LUNGS:  Clear without wheezes.  HEART:  Regular rate and rhythm.  ABDOMEN:  Soft, nontender, normal bowel sounds.  VITAL SIGNS:  Temperature 98 degrees, pulse 86, respirations 18, blood pressure on admission 151/96.   LABORATORY RESULTS:  The patient has a drug screen positive for cannabis. TSH normal at 1.4. Alcohol undetectable. Chemistry panel normal, except for a slightly elevated glucose at 113. CBC unremarkable. Urinalysis unremarkable.   ASSESSMENT:  This is a 24 year old woman with a history of bipolar disorder, most recently manic, who comes back to the hospital just 3 days after her last discharge. The circumstances bringing her back are not entirely clear. She is somewhat vague about her reason for representing, and there is no indication of acute dangerousness. The patient appears to be more stable and less psychotic than she was on her previous hospitalization.   TREATMENT PLAN:  Continue current medications. Engage her in daily groups and activities. The patient is complaining of having a vaginal discharge and requests treatment for bacterial vaginosis, which is easy to do with topical metronidazole. She will be involved in groups and activities on the unit. If possible, we will try and get some collateral history to understand better what the situation is.   DIAGNOSIS,  PRINCIPAL AND PRIMARY:  AXIS I: Bipolar disorder, most recent episode manic.   SECONDARY DIAGNOSES: AXIS I:  Cannabis abuse.  AXIS II:  Deferred.  AXIS III:  Bacterial vaginosis and chronic back pain.   AXIS IV:  Moderate stress from no really clear place to settle.  AXIS V:  Functioning at time of admission is 45.    ____________________________ Audery AmelJohn T. Clapacs, MD jtc:dmm D: 02/08/2013 22:31:30 ET T: 02/08/2013 22:52:48 ET JOB#: [id number]  cc: Audery AmelJohn T. Clapacs, MD, <Dictator> Audery AmelJOHN T CLAPACS MD ELECTRONICALLY SIGNED 02/09/2013 22:17

## 2015-01-19 NOTE — Discharge Summary (Signed)
PATIENT NAME:  Claire Taylor, Claire Taylor MR#:  [id number] DATE OF BIRTH:  08-08-1991  DATE OF ADMISSION:  01/25/2013 DATE OF DISCHARGE:  02/04/2013  HOSPITAL COURSE: See dictated history and physical for details of admission.   The patient is a 24 year old woman with a past history of psychotic symptoms and mood disorder admitted to the hospital with a combination of symptoms that appeared to be manic in nature. In the hospital, the patient actually appeared to decompensate for some period of time. She remained psychotic and paranoid. She revealed ideas of reference as well as some delusions of control at times. She was cooperative with medication for the most part although on a couple of occasions, she became very paranoid and required forced medicine orders. The patient showed poor insight for much of her hospital stay. A lot of this was reinforced by her family who unfortunately seemed to take her delusions at face value and not to have the best insight concerning her illness. Education was attempted by the physician as well as members of the nursing staff with the patient and her family to probably better effect with the patient.   Over the last couple of days, she has shown significant improvement. She has calmed down her behavior to where she is no longer agitated, threatening or hostile. She has been compliant with current medication. She remained somewhat blunted and distant and has a paranoid air about her, but is not behaving in a dangerous manner. She has consistently denied any suicidal ideation and not shown any inclination to harm herself. She is agreeable to outpatient treatment as it has been arranged. She is not showing any acute side effects to her medicine. The patient has been educated about the nature of her diagnosis, which at this point appears to most likely be schizoaffective disorder, bipolar type with manic features most recent. She has been educated on the importance of staying compliant with  medication and treatment to avoid bad outcomes such as re-hospitalization or worse. She states an understanding of this.   DISCHARGE MEDICATIONS:  Clonazepam 0.5 mg twice a day, docusate 100 mg twice a day, fluoxetine 10 mg per day, Naprosyn 500 mg twice a day, Zyprexa 10 mg 3 times a day, Zanaflex 2 mg 4 times a day.   LABORATORY RESULTS: Admission labs performed on 04/29 show a drug screen positive for opiates and cannabis. TSH normal at 1.1. Alcohol low with positive at 14. Chemistry panel just showed a very slightly low potassium at 3.2, otherwise normal. CBC elevated white count at 13.4. Urinalysis unremarkable.   MENTAL STATUS EXAMINATION AT DISCHARGE:  Reasonably well groomed and dressed woman who looks her stated age. Cooperative with the interview, although somewhat standoffish still. Eye contact good. Psychomotor activity restrained. Speech quiet, but easy to understand, restrained in amount. Affect still blunted. Mood stated as being fine. Thoughts still gives some indication of some halting and somewhat paranoid cast, but she denies hallucinations and is not making any delusional or bizarre statements or threats. She shows normal intelligence and an ability to understand education about the nature of her illness and the importance of follow-up treatment. Short and long-term memory appear to be grossly intact.   DISPOSITION: She is discharged home to her family. She says she plans to stay with her "Laney Potashana" for the  foreseeable future but will be caring for her child. She is to follow up with [facility] and has a scheduled appointment.   DIAGNOSIS, PRINCIPAL AND PRIMARY: AXIS I:  Schizoaffective disorder,  bipolar type, most recent symptoms, manic.   SECONDARY DIAGNOSES: AXIS I:  Cannabis abuse.  AXIS II:  Deferred.  AXIS III:  Chronic pain, musculoskeletal in origin.  AXIS IV:  Severe from chronic illness and lack of financial resources.  AXIS V:  Functioning at time of discharge 55.      ____________________________ Audery Amel, MD jtc:ct D: 02/04/2013 15:40:30 ET T: 02/05/2013 11:06:05 ET JOB#: [id number]  cc: Audery Amel, MD, <Dictator> Audery Amel MD ELECTRONICALLY SIGNED 02/06/2013 21:50

## 2015-01-19 NOTE — Consult Note (Signed)
Brief Consult Note: Diagnosis: Ovarian Cyst and Pelvic Pain.   Patient was seen by consultant.   Consult note dictated.   Comments: Expectant management vs surgery as options for treatment of 4 cm Right Ovarian Cyst. Rec Expectant Management for now.  Analgesia with Tylenol, Ibuprofen, Vicodin all appropriate for short term management of pain from cyst.  If severe pain conside re-evaluation, for torsion or rupture of cyst.  Most cysts come and go without need for surgical intervention, and this is disucssed w patient.  Cont Mirena IntraUterine Device as it is working for contraception and needs time for hormonal transition as it has only been in place for 2 mos.  Pain is mild on exam and she does not have an acute abdomen or signs of torsion at this time.  Please [facility] upon discharge, and I believe she may have appointment w Dr Janene HarveyKlett in this regards as previously arranged.  Thank you for this consult.  Electronic Signatures: Letitia LibraHarris, Quincy Prisco Paul (MD)  (Signed 19-May-14 22:01)  Authored: Brief Consult Note   Last Updated: 19-May-14 22:01 by Letitia LibraHarris, Maliik Karner Paul (MD)

## 2015-01-20 NOTE — Consult Note (Signed)
PATIENT NAME:  Claire Taylor, Allyah MR#:  [id number] DATE OF BIRTH:  Oct 28, 1990  DATE OF CONSULTATION:  09/14/2014  REFERRING PHYSICIAN:   CONSULTING PHYSICIAN:  Audery AmelJohn T. Nickolaos Brallier, MD  IDENTIFYING INFORMATION AND REASON FOR CONSULTATION: A 24 year old woman with a history of bipolar disorder who was sent here initially voluntarily from her primary care office because of mood instability.   CHIEF COMPLAINT: To me, "You know what the problem is."   HISTORY OF PRESENT ILLNESS: Information obtained from the patient and the chart. She went to her primary care [facility] at [facility] and was exhibiting labile and depressive behaviors, talking about being suicidal. She was encouraged to come over to our facility. I attempted to interview her, but she would not cooperate. Stared at me and said a few incoherent things, and then cursed me to my face and refused to talk any further and became very agitated. The accompanying paperwork suggests that she has been off of her medicine, possibly fora months. Mood was bad. Was making some statements that suggested suicidality, appeared to be paranoid. Other stresses unknown at this point.   PAST PSYCHIATRIC HISTORY: History of bipolar disorder that responded antipsychotics and mood stabilizers in the past. Also, a history of presenting very agitated and psychotic when she was off of her medicine. No clear known suicide history, but she does have a history of aggression.   SUBSTANCE ABUSE HISTORY: Has a history of abusing marijuana,  no other known ongoing substance abuse problems. Denies alcohol use.   SOCIAL HISTORY: It is unknown at this point exactly what her social situation is. She told me briefly that she was living with one of her sisters. Said that she did not work outside the home. Does get Social Security.   FAMILY HISTORY: Unknown.   PAST MEDICAL HISTORY: No known ongoing medical issues identified.   CURRENT MEDICATIONS: Quetiapine 300 mg at night,  Seroquel 25 mg 3 times a day, Klonopin 0.5 mg twice a day, Prozac 10 mg a day.   ALLERGIES: RELPAX, BANANAS, MYLANTA.   REVIEW OF SYSTEMS: The patient really, will not cooperate with any answers to this, but does not report any specific physical complaints.   MENTAL STATUS EXAMINATION: Neatly groomed woman, who was agitated, tearful and upset from the time she got to the Emergency Room. Eye contact with me he was initially very intense and then she refused to look at me. Psychomotor activity: Animated and activated with a lot of pacing. Speech rapid and loud. Thoughts disorganized and paranoid. Denies suicidal or homicidal ideation. Will not discuss hallucinations., Will not cooperate with cognitive testing. She does remember me, and she is alert and oriented x 4.   LABORATORY RESULTS: Drug screen positive for cannabis. Urinalysis: Unremarkable. CBC: Slightly elevated white count 12.2. Chemistry panel: Slightly elevated glucose 115. Nothing else remarkable. Pregnancy test: Negative.   VITAL SIGNS: Blood pressure is 143/92, respirations 24, pulse 123, temperature 98.   ASSESSMENT: This is a 24 year old woman with a history of bipolar disorder, who presents with mixed symptoms, including tearfulness, sobbing, anger, psychosis. Uncooperative initially with treatment, but now very sedated after getting some IM medicine. Clearly psychotic and decompensated, probably from  med noncompliance. Needs hospital level treatment.   TREATMENT PLAN: Reinitiate orders for all of her usual outpatient medicines as noted above. She was given a shot of Geodon and Ativan earlier, which has got her sedated now. Re-evaluate tomorrow. We do not have any beds currently available on the psychiatry ward.  If some open up tomorrow, we will be able to admit her.   DIAGNOSIS, PRINCIPAL AND PRIMARY: AXIS I: Bipolar disorder type I, manic.   SECONDARY DIAGNOSES: AXIS I: No further.  AXIS II: Deferred.  AXIS III: No diagnosis.    ____________________________ Audery Amel, MD jtc:mw D: 09/14/2014 18:55:56 ET T: 09/14/2014 19:03:08 ET JOB#: [id number]  cc: Audery Amel, MD, <Dictator> Audery Amel MD ELECTRONICALLY SIGNED 09/20/2014 0:41

## 2015-01-20 NOTE — Consult Note (Signed)
Psychiatry: Patient seen and chart reviewed. Patient presented last week in an agitated and psychotic state. Since then has been complient with medication and affect has calmed down a lot. tells me today she is feeling better and is sorry for previous behavior. Denies any suicidal ideation or hallucinations.  calm. Thoughts lucid. Speach appropriate Affect calm. Lucid and not obviously delusional. Denies SI or HI. Alert and oriented intact memory. improved on medication. Will admit to [facility] and canccell plan for [facility] referral.Bipolar disorder manic.  Electronic Signatures: Zykera Abella, Jackquline DenmarkJohn T (MD)  (Signed on 22-Dec-15 11:39)  Authored  Last Updated: 22-Dec-15 11:39 by Audery Amellapacs, Darik Massing T (MD)
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
## 2015-02-06 NOTE — H&P (Signed)
L&D Evaluation:  History Expanded:   HPI 24 yo G2P0010 whose EDC = 7/28.  [facility] to awsk whether or not she can take a tylenol # for a HA, she therefore come in by EMS.  Pt denies other Sx, was here last pm for a similar Sx.    Presents with HA    Patient's Medical History Anxiety, depression, stress    Patient's Surgical History hernia as a child    Medications Pre Natal Vitamins  zoloft, protonix    Allergies Mylanta - rash, Keflex "it makes me bipolar", railpex    Social History none    Family History mother has migraines   Exam:   General no apparent distress    Mental Status clear    Chest clear    Heart normal sinus rhythm    Abdomen gravid, non-tender    Estimated Fetal Weight Average for gestational age    Reflexes 2+  CNS II-XII intack    Ucx absent   Impression:   Impression Mild HA   Plan:   Comments I have advised her taht taking a tylenol # is OK   Electronic Signatures: Towana Badgerosenow, Asmi Fugere J (MD)  (Signed 19-Jul-13 16:50)  Authored: L&D Evaluation   Last Updated: 19-Jul-13 16:50 by Towana Badgerosenow, Ladean Steinmeyer J (MD)

## 2015-02-06 NOTE — H&P (Signed)
L&D Evaluation:  History:   HPI 24 yo G2P0010 at 39 weeks 3 days, EDD 7/28 who arrived to L&D with c/o ctx's.  Pt has been having ctx's irregularly for days but felt like they got stronger last night. She was 3 cm in office yesterday and 4 cm on arrival herre but only having irreg ctx's.  [facility], no significant events during pregnancy. GBS Negative    Presents with contractions    Patient's Medical History Anxiety, depression, stress    Patient's Surgical History hernia as a child    Medications Pre Natal Vitamins  zoloft, protonix    Allergies Mylanta - rash, Keflex "it makes me bipolar", railpex    Social History none    Family History mother has migraines   ROS:   ROS All systems were reviewed.  HEENT, CNS, GI, GU, Respiratory, CV, Renal and Musculoskeletal systems were found to be normal.   Exam:   Vital Signs stable    Urine Protein not completed    General no apparent distress    Mental Status clear    Chest clear    Abdomen gravid, tender with contractions    Estimated Fetal Weight Average for gestational age, approx 7 lbs    Reflexes 2+    Pelvic no external lesions, 4/80 per RN this AM    Mebranes Intact    FHT normal rate with no decels    Ucx irregular   Impression:   Impression early labor, 39+ weeks   Plan:   Plan EFM/NST, monitor contractions and for cervical change, admit for labor, AROM   Electronic Signatures: Shella Maximutnam, Lajarvis Italiano (CNM)  (Signed 24-Jul-13 08:48)  Authored: L&D Evaluation   Last Updated: 24-Jul-13 08:48 by Shella MaximPutnam, Malissa Slay (CNM)

## 2015-04-22 IMAGING — CR DG CHEST 2V
1 series · 2 of 2 positions shown · non-contrast
Comparison: 07/28/2011

CLINICAL DATA: Cough and sore throat.

EXAM:
CHEST  2 VIEW

[Series 1: w chest pa · 0.14mm/px · 2 of 2 slices shown]
[im 1/2]
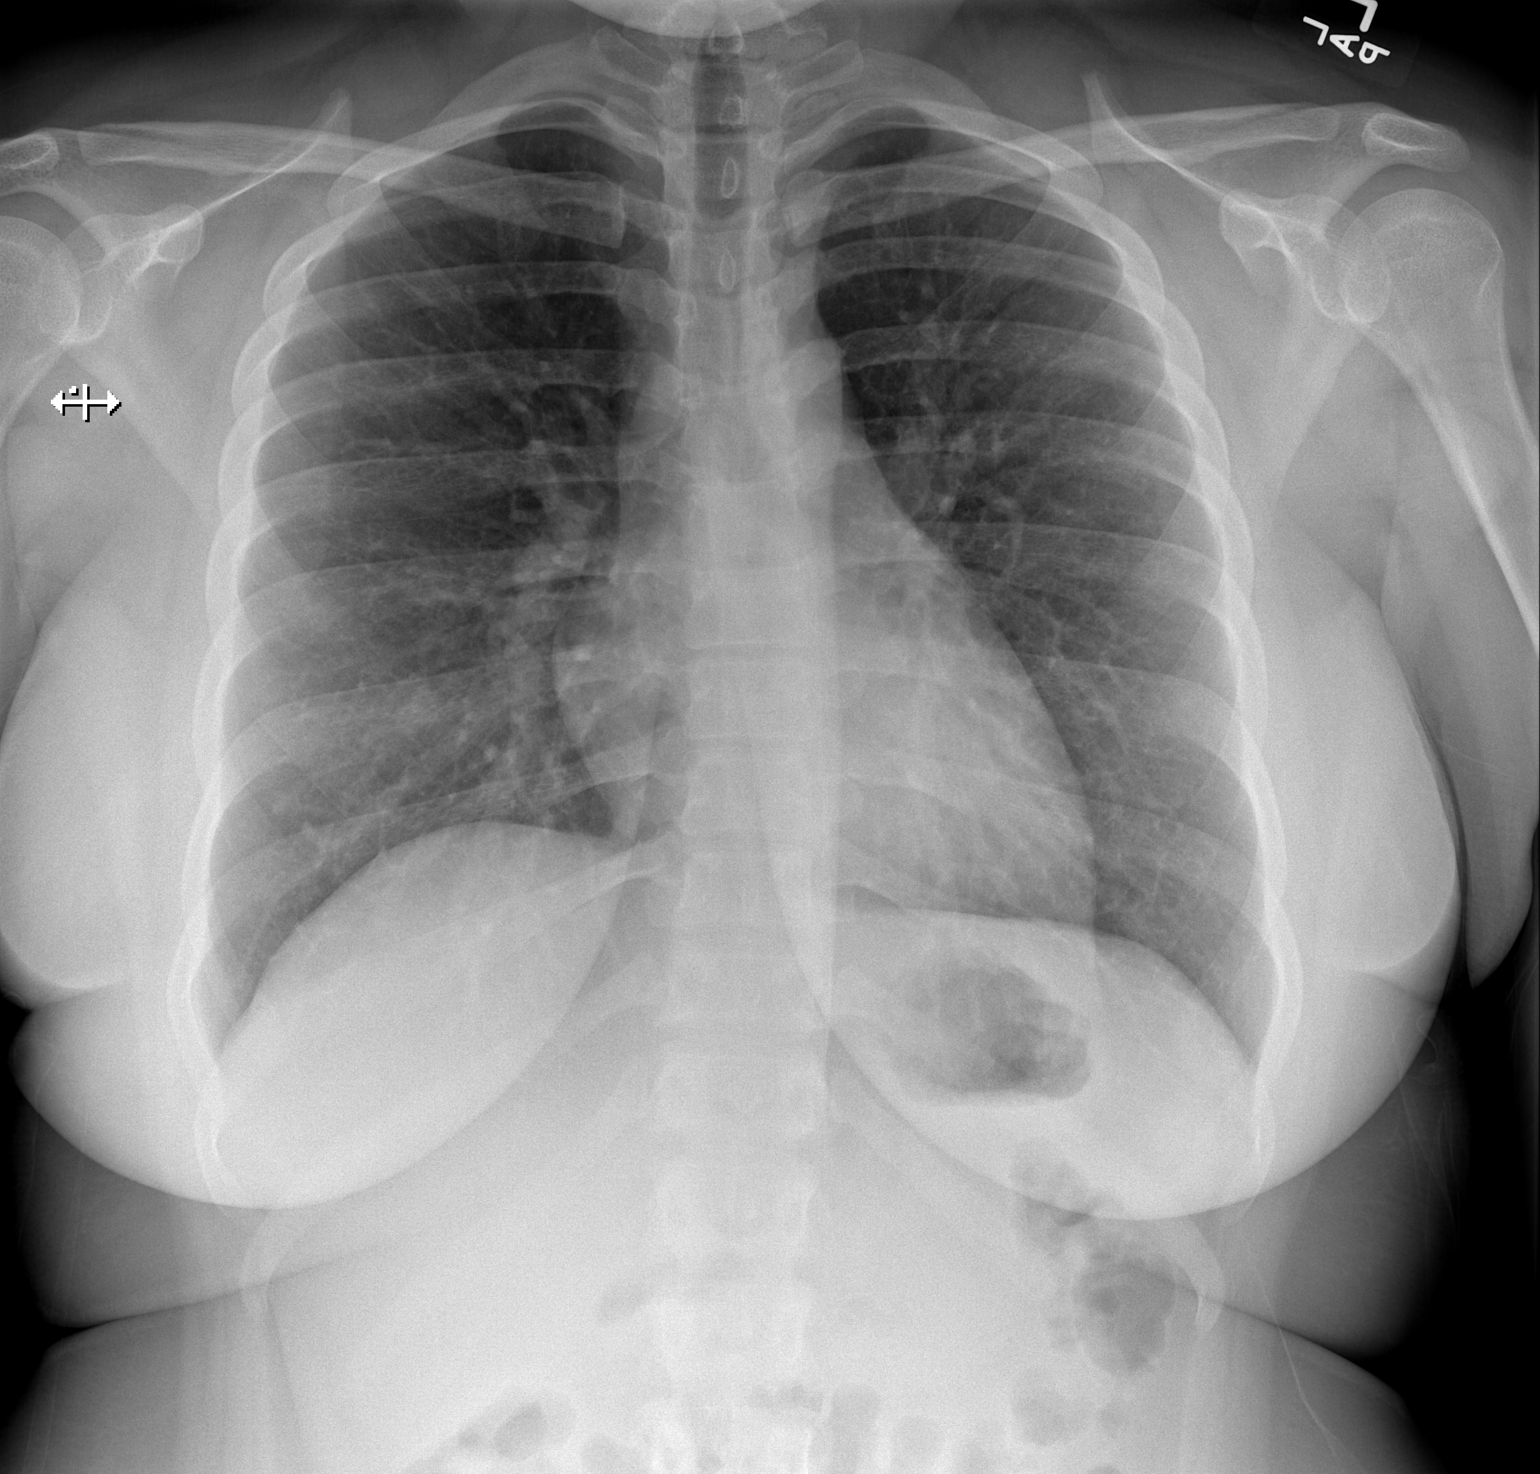
[im 2/2]
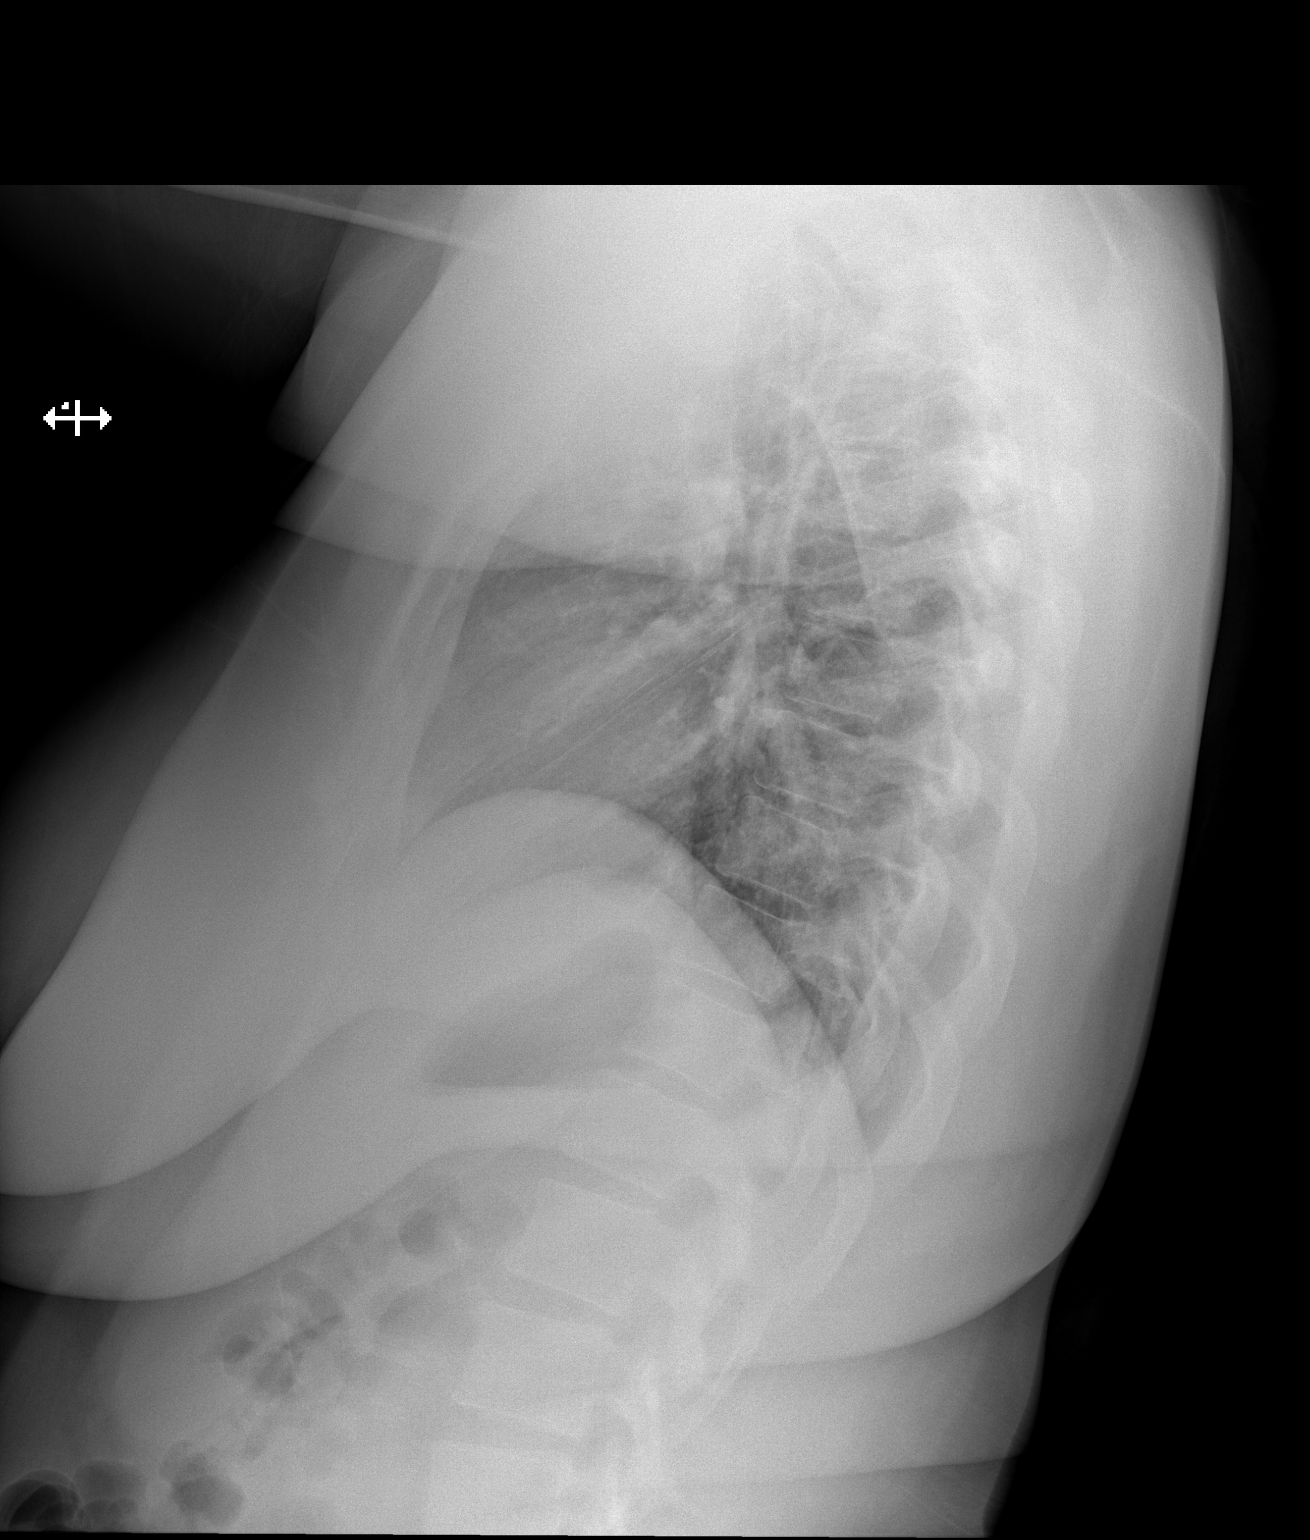

[2 of 2 positions shown; findings below may reference images not displayed]

FINDINGS: The heart size and mediastinal contours are within normal limits.
Both lungs are clear. The visualized skeletal structures are
unremarkable.
IMPRESSION: No active cardiopulmonary disease.

## 2015-12-27 DIAGNOSIS — J45909 Unspecified asthma, uncomplicated: Secondary | ICD-10-CM | POA: Insufficient documentation

## 2016-04-11 ENCOUNTER — Emergency Department
Admission: EM | Admit: 2016-04-11 | Discharge: 2016-04-11 | Disposition: A | Discharge status: Home / Self Care | Payer: Medicaid Other | Attending: Emergency Medicine | Admitting: Emergency Medicine

## 2016-04-11 ENCOUNTER — Emergency Department: Payer: Medicaid Other

## 2016-04-11 ENCOUNTER — Encounter: Payer: Self-pay | Admitting: Emergency Medicine

## 2016-04-11 DIAGNOSIS — Z79899 Other long term (current) drug therapy: Secondary | ICD-10-CM | POA: Diagnosis not present

## 2016-04-11 DIAGNOSIS — F17200 Nicotine dependence, unspecified, uncomplicated: Secondary | ICD-10-CM | POA: Diagnosis not present

## 2016-04-11 DIAGNOSIS — N938 Other specified abnormal uterine and vaginal bleeding: Secondary | ICD-10-CM | POA: Diagnosis present

## 2016-04-11 DIAGNOSIS — R102 Pelvic and perineal pain: Secondary | ICD-10-CM | POA: Diagnosis not present

## 2016-04-11 DIAGNOSIS — R55 Syncope and collapse: Secondary | ICD-10-CM

## 2016-04-11 DIAGNOSIS — F1290 Cannabis use, unspecified, uncomplicated: Secondary | ICD-10-CM | POA: Diagnosis not present

## 2016-04-11 DIAGNOSIS — F3130 Bipolar disorder, current episode depressed, mild or moderate severity, unspecified: Secondary | ICD-10-CM | POA: Diagnosis not present

## 2016-04-11 LAB — WET PREP, GENITAL
Clue Cells Wet Prep HPF POC: NONE SEEN
SPERM: NONE SEEN
TRICH WET PREP: NONE SEEN
Yeast Wet Prep HPF POC: NONE SEEN

## 2016-04-11 LAB — CBC
HCT: 38.3 % (ref 35.0–47.0)
Hemoglobin: 12.7 g/dL (ref 12.0–16.0)
MCH: 29.7 pg (ref 26.0–34.0)
MCHC: 33.1 g/dL (ref 32.0–36.0)
MCV: 89.8 fL (ref 80.0–100.0)
Platelets: 323 10*3/uL (ref 150–440)
RBC: 4.27 MIL/uL (ref 3.80–5.20)
RDW: 13.9 % (ref 11.5–14.5)
WBC: 13.3 10*3/uL — AB (ref 3.6–11.0)

## 2016-04-11 LAB — POCT PREGNANCY, URINE: PREG TEST UR: NEGATIVE

## 2016-04-11 LAB — CHLAMYDIA/NGC RT PCR (ARMC ONLY)
Chlamydia Tr: NOT DETECTED
N GONORRHOEAE: NOT DETECTED

## 2016-04-11 LAB — URINALYSIS COMPLETE WITH MICROSCOPIC (ARMC ONLY)
BILIRUBIN URINE: NEGATIVE
GLUCOSE, UA: NEGATIVE mg/dL
HGB URINE DIPSTICK: NEGATIVE
LEUKOCYTES UA: NEGATIVE
NITRITE: NEGATIVE
Protein, ur: 30 mg/dL — AB
SPECIFIC GRAVITY, URINE: 1.031 — AB (ref 1.005–1.030)
pH: 6 (ref 5.0–8.0)

## 2016-04-11 NOTE — ED Notes (Signed)
States the strings to her IUD have been lost for a year, today having abd pain and stabbing feeling in pelvic area and heavy bleeding.

## 2016-04-11 NOTE — ED Provider Notes (Signed)
Ultrasound unremarkable IUD in place. Vital signs normal. Discharge home. Follow gyn  Sharman CheekPhillip Nicholai Willette, MD 04/11/16 2229

## 2016-04-11 NOTE — ED Provider Notes (Signed)
[facility] Emergency Department Provider Note   ____________________________________________  Time seen: Approximately 8 PM  I have reviewed the triage vital signs and the nursing notes.   HISTORY  Chief Complaint Vaginal Bleeding   HPI Claire Taylor is a 25 y.o. female with a history of depression as well as an IUD was presenting to the emergency department today for persistent abdominal cramping as well as vaginal bleeding. She says that over the past year and a half Has been able to find the strings of her IUD. She says that she has had discussions with her OB/GYN at [facility] side OB/GY[facility] to have the IUD removed and appointment today but was not able to make it because she had to cover for another employee at her job. She says that she also has a white vaginal discharge that she has had over the past 3 months. Has low concern for STDs at this time. Says she is concerned about the location of her IUD.Denies any bleeding today but says that the bleeding has been heavy at times. He should also said that she was getting lightheaded earlier at her job and was concerned that she had lost a considerable amount of blood recently.   Past Medical History  Diagnosis Date  . Depression   . Anxiety   . H/O umbilical hernia repair     Patient Active Problem List   Diagnosis Date Noted  . Bipolar I disorder, most recent episode (or current) depressed, unspecified 02/13/2014  . Cannabis abuse, daily use 02/09/2014  . Other and unspecified ovarian cyst 03/12/2013  . Abdominal pain, other specified site 03/12/2013    Past Surgical History  Procedure Laterality Date  . Adenoidectomy      Current Outpatient Rx  Name  Route  Sig  Dispense  Refill  . clonazePAM (KLONOPIN) 0.5 MG tablet   Oral   Take 1 tablet by mouth 2 (two) times daily.      2   . divalproex (DEPAKOTE) 500 MG DR tablet   Oral   Take 1 tablet by mouth 2 (two) times daily.      1   .  FLUoxetine (PROZAC) 40 MG capsule   Oral   Take 1 capsule by mouth daily.      11   . gabapentin (NEURONTIN) 300 MG capsule   Oral   Take 1 capsule by mouth 3 (three) times daily.      11   . tiZANidine (ZANAFLEX) 4 MG tablet   Oral   Take 1 tablet by mouth 3 (three) times daily as needed.      1     Allergies Banana; Mylanta; and Relpax  No family history on file.  Social History Social History  Substance Use Topics  . Smoking status: Heavy Tobacco Smoker -- 0.50 packs/day  . Smokeless tobacco: None  . Alcohol Use: Yes    Review of Systems Constitutional: No fever/chills Eyes: No visual changes. ENT: No sore throat. Cardiovascular: Denies chest pain. Respiratory: Denies shortness of breath. Gastrointestinal:   No nausea, no vomiting.  No diarrhea.  No constipation. Genitourinary: Negative for dysuria. Musculoskeletal: Negative for back pain. Skin: Negative for rash. Neurological: Negative for headaches, focal weakness or numbness.  10-point ROS otherwise negative.  ____________________________________________   PHYSICAL EXAM:  VITAL SIGNS: ED Triage Vitals  Enc Vitals Group     BP 04/11/16 1818 126/70 mmHg     Pulse Rate 04/11/16 1818 122     Resp  04/11/16 1818 18     Temp 04/11/16 1818 98.2 F (36.8 C)     Temp Source 04/11/16 1818 Oral     SpO2 04/11/16 1818 98 %     Weight 04/11/16 1818 200 lb (90.719 kg)     Height 04/11/16 1818 5\' 5"  (1.651 m)     Head Cir --      Peak Flow --      Pain Score 04/11/16 1813 8     Pain Loc --      Pain Edu? --      Excl. in GC? --     Constitutional: Alert and oriented. Well appearing and in no acute distress. Eyes: Conjunctivae are normal. PERRL. EOMI. Head: Atraumatic. Nose: No congestion/rhinnorhea. Mouth/Throat: Mucous membranes are moist.   Neck: No stridor.   Cardiovascular: Normal rate, regular rhythm. Grossly normal heart sounds.   Respiratory: Normal respiratory effort.  No retractions. Lungs  CTAB. Gastrointestinal: Soft with mild suprapubic tenderness palpation without any masses. No distention. No CVA tenderness. Genitourinary: Normal external appearance without any masses. Speculum exam with mild mucus-like discharge from the cervix. Unable to visualize the strings from the cervix from the IUD. Bimanual exam without any CMT. There is no uterine or adnexal tenderness to palpation. Normal masses. Musculoskeletal: No lower extremity tenderness nor edema.  No joint effusions. Neurologic:  Normal speech and language. No gross focal neurologic deficits are appreciated.  Skin:  Skin is warm, dry and intact. No rash noted. Psychiatric: Mood and affect are normal. Speech and behavior are normal.  ____________________________________________   LABS (all labs ordered are listed, but only abnormal results are displayed)  Labs Reviewed  WET PREP, GENITAL - Abnormal; Notable for the following:    WBC, Wet Prep HPF POC MODERATE (*)    All other components within normal limits  CBC - Abnormal; Notable for the following:    WBC 13.3 (*)    All other components within normal limits  URINALYSIS COMPLETEWITH MICROSCOPIC ([facility] ONLY) - Abnormal; Notable for the following:    Color, Urine YELLOW (*)    APPearance CLEAR (*)    Ketones, ur TRACE (*)    Specific Gravity, Urine 1.031 (*)    Protein, ur 30 (*)    Bacteria, UA RARE (*)    Squamous Epithelial / LPF 0-5 (*)    All other components within normal limits  CHLAMYDIA/NGC RT PCR ([facility] ONLY)  POC URINE PREG, ED  POCT PREGNANCY, URINE   ____________________________________________  EKG  ED ECG REPORT I, Tammie Ellsworth,  Teena Irani, the attending physician, personally viewed and interpreted this ECG.   Date: 04/11/2016  EKG Time: 1945  Rate: 88  Rhythm: normal sinus rhythm  Axis: Normal axis  Intervals:none  ST&T Change: No ST segment elevation or depression. No abnormal T-wave  inversion.  ____________________________________________  RADIOLOGY  [place] [facility] ultrasound ____________________________________________   PROCEDURES   Procedures   ____________________________________________   INITIAL IMPRESSION / ASSESSMENT AND PLAN / ED COURSE  Pertinent labs & imaging results that were available during my care of the patient were reviewed by me and considered in my medical decision making (see chart for details).  ----------------------------------------- 9:28 PM on 04/11/2016 -----------------------------------------  Patient without any further complaints. Unclear cause for the near syncope. Possibly vasovagal. Unlikely to be pulmonary embolus as the patient has no chest pain or shortness of breath. Was initially tachycardic but since being in the room on the monitor the patient has been with a heart rate in  the 80s and 90s. Appears to have a chronically elevated white blood cell count but normal hemoglobin. Awaiting ultrasound of the pelvis for confirmation of the position of the IUD.   Signed out to Dr. Scotty CourtStafford for follow-up. ____________________________________________   FINAL CLINICAL IMPRESSION(S) / ED DIAGNOSES  Final diagnoses:  Pelvic pain in female  Pelvic pain in female   Near-syncope.   NEW MEDICATIONS STARTED DURING THIS VISIT:  New Prescriptions   No medications on file     Note:  This document was prepared using Dragon voice recognition software and may include unintentional dictation errors.    Myrna Blazeravid Matthew Renata Gambino, MD 04/11/16 2130

## 2016-04-11 NOTE — ED Notes (Signed)
Pt refused to sign e-signature stating that "I'm not gonna do nothing for you, ya'll aint done nothing for me". Dr. Scotty CourtStafford came in to see pt and explain that we do not remove IUD's here and that is up to OB. Pt stated to this RN that she "may as well reach up there, and yank it out myself".

## 2016-04-16 ENCOUNTER — Encounter
Admission: RE | Admit: 2016-04-16 | Discharge: 2016-04-16 | Disposition: A | Discharge status: Home / Self Care | Payer: Medicaid Other | Source: Ambulatory Visit | Attending: Obstetrics and Gynecology | Admitting: Obstetrics and Gynecology

## 2016-04-16 ENCOUNTER — Encounter: Payer: Self-pay | Admitting: *Deleted

## 2016-04-16 HISTORY — DX: Anemia, unspecified: D64.9

## 2016-04-16 HISTORY — DX: Plantar fascial fibromatosis: M72.2

## 2016-04-16 HISTORY — DX: Gastro-esophageal reflux disease without esophagitis: K21.9

## 2016-04-16 HISTORY — DX: Unspecified asthma, uncomplicated: J45.909

## 2016-04-16 NOTE — Pre-Procedure Instructions (Signed)
PT WAS TO BE A PHONE INTERVIEW TODAY BUT SHOWED UP OVER HERE IN [facility]- I HAD NO ORDERS SO I CALLED NANCY AT [facility] AND LEFT HER A MESSAGE THAT DR Jean RosenthalJACKSON NEEDED TO PUT HIS ORDERS IN EPIC- I DID INTERVIEW WITH PT IN ROOM AND THEN I LOOKED IN EPIC AGAIN BEFORE I LET HER GON AND THERE WERE STILL NO ORDERS- PT HAD JUST HAD A CBC DONE ON 04-11-16 IN ED SO I MADE THE CHOICE TO LET PT GO SINCE SHE NEEDED NO OTHER BLOODWORK FROM ANESTHESIAS STANDPOINT- DR Jean RosenthalJACKSON FINALLY PUT IN ORDERS AFTER PT WAS GONE-LABS WILL HAVE TO BE DRAWN AM OF SURGERY

## 2016-04-16 NOTE — Patient Instructions (Signed)
  Your procedure is scheduled on: 04-17-16 ( Thursday) Report to Same Day Surgery 2nd floor medical mall To find out your arrival time please call [phone] between 1PM - 3PM on 04-16-16 [facility](WEDNESDAY)  Remember: Instructions that are not followed completely may result in serious medical risk, up to and including death, or upon the discretion of your surgeon and anesthesiologist your surgery may need to be rescheduled.    _x___ 1. Do not eat food or drink liquids after midnight. No gum chewing or hard candies.     __x__ 2. No Alcohol for 24 hours before or after surgery.   __x__3. No Smoking for 24 prior to surgery.   ____  4. Bring all medications with you on the day of surgery if instructed.    __x__ 5. Notify your doctor if there is any change in your medical condition     (cold, fever, infections).     Do not wear jewelry, make-up, hairpins, clips or nail polish.  Do not wear lotions, powders, or perfumes. You may wear deodorant.  Do not shave 48 hours prior to surgery. Men may shave face and neck.  Do not bring valuables to the hospital.    [facility] any belongings or valuables.               Contacts, dentures or bridgework may not be worn into surgery.  Leave your suitcase in the car. After surgery it may be brought to your room.  For patients admitted to the hospital, discharge time is determined by your treatment team.   Patients discharged the day of surgery will not be allowed to drive home.    Please read over the following fact sheets that you were given:   [facility] Preparing for Surgery and or MRSA Information   _x___ Take these medicines the morning of surgery with A SIP OF WATER:    1. KLONOPIN (CLONAZEPAM)  2. DEPAKOTE (DIVALPROEX)  3. PROZAC (FLUOXETINE)  4. GABAPENTIN (NEURONTIN)  5. SEROQUEL (QUETIAPINE)  6.  ____ Fleet Enema (as directed)   ____ Use CHG Soap or sage wipes as directed on instruction sheet   __X__ Use  inhalers on the day of surgery and bring to hospital day of surgery-USE ALBUTEROL INHALER AT HOME AND BRING TO HOSPITAL  ____ Stop metformin 2 days prior to surgery    ____ Take 1/2 of usual insulin dose the night before surgery and none on the morning of surgery.   ____ Stop aspirin or coumadin, or plavix  _x__ Stop Anti-inflammatories such as Advil, Aleve, Ibuprofen, Motrin, Naproxen,          Naprosyn, Goodies powders or aspirin products. Ok to take Tylenol.   ____ Stop supplements until after surgery.    ____ Bring C-Pap to the hospital.

## 2016-04-17 ENCOUNTER — Encounter
Admission: RE | Disposition: A | Discharge status: Home / Self Care | Payer: Self-pay | Source: Ambulatory Visit | Attending: Obstetrics and Gynecology

## 2016-04-17 ENCOUNTER — Ambulatory Visit
Admission: RE | Admit: 2016-04-17 | Discharge: 2016-04-17 | Disposition: A | Discharge status: Home / Self Care | Payer: Medicaid Other | Source: Ambulatory Visit | Attending: Obstetrics and Gynecology | Admitting: Obstetrics and Gynecology

## 2016-04-17 ENCOUNTER — Encounter: Payer: Self-pay | Admitting: *Deleted

## 2016-04-17 ENCOUNTER — Ambulatory Visit: Payer: Medicaid Other | Admitting: Certified Registered Nurse Anesthetist

## 2016-04-17 DIAGNOSIS — F17200 Nicotine dependence, unspecified, uncomplicated: Secondary | ICD-10-CM | POA: Insufficient documentation

## 2016-04-17 DIAGNOSIS — R011 Cardiac murmur, unspecified: Secondary | ICD-10-CM | POA: Insufficient documentation

## 2016-04-17 DIAGNOSIS — Z862 Personal history of diseases of the blood and blood-forming organs and certain disorders involving the immune mechanism: Secondary | ICD-10-CM | POA: Diagnosis not present

## 2016-04-17 DIAGNOSIS — F319 Bipolar disorder, unspecified: Secondary | ICD-10-CM | POA: Insufficient documentation

## 2016-04-17 DIAGNOSIS — T8332XA Displacement of intrauterine contraceptive device, initial encounter: Secondary | ICD-10-CM | POA: Diagnosis not present

## 2016-04-17 DIAGNOSIS — Y838 Other surgical procedures as the cause of abnormal reaction of the patient, or of later complication, without mention of misadventure at the time of the procedure: Secondary | ICD-10-CM | POA: Diagnosis not present

## 2016-04-17 DIAGNOSIS — F419 Anxiety disorder, unspecified: Secondary | ICD-10-CM | POA: Diagnosis not present

## 2016-04-17 DIAGNOSIS — J45909 Unspecified asthma, uncomplicated: Secondary | ICD-10-CM | POA: Diagnosis not present

## 2016-04-17 HISTORY — PX: HYSTEROSCOPY: SHX211

## 2016-04-17 HISTORY — PX: IUD REMOVAL: SHX5392

## 2016-04-17 HISTORY — DX: Displacement of intrauterine contraceptive device, initial encounter: T83.32XA

## 2016-04-17 LAB — COMPREHENSIVE METABOLIC PANEL
ALT: 12 U/L — AB (ref 14–54)
ANION GAP: 5 (ref 5–15)
AST: 25 U/L (ref 15–41)
Albumin: 3.8 g/dL (ref 3.5–5.0)
Alkaline Phosphatase: 56 U/L (ref 38–126)
BUN: 17 mg/dL (ref 6–20)
CHLORIDE: 107 mmol/L (ref 101–111)
CO2: 27 mmol/L (ref 22–32)
CREATININE: 0.75 mg/dL (ref 0.44–1.00)
Calcium: 8.7 mg/dL — ABNORMAL LOW (ref 8.9–10.3)
Glucose, Bld: 99 mg/dL (ref 65–99)
POTASSIUM: 3.9 mmol/L (ref 3.5–5.1)
SODIUM: 139 mmol/L (ref 135–145)
Total Bilirubin: 0.4 mg/dL (ref 0.3–1.2)
Total Protein: 6.8 g/dL (ref 6.5–8.1)

## 2016-04-17 LAB — CBC
HCT: 35.7 % (ref 35.0–47.0)
Hemoglobin: 12.3 g/dL (ref 12.0–16.0)
MCH: 30.6 pg (ref 26.0–34.0)
MCHC: 34.3 g/dL (ref 32.0–36.0)
MCV: 89.1 fL (ref 80.0–100.0)
PLATELETS: 286 10*3/uL (ref 150–440)
RBC: 4.01 MIL/uL (ref 3.80–5.20)
RDW: 13.9 % (ref 11.5–14.5)
WBC: 9.9 10*3/uL (ref 3.6–11.0)

## 2016-04-17 LAB — TYPE AND SCREEN
ABO/RH(D): O POS
ANTIBODY SCREEN: NEGATIVE

## 2016-04-17 LAB — URINE DRUG SCREEN, QUALITATIVE (ARMC ONLY)
Amphetamines, Ur Screen: NOT DETECTED
BARBITURATES, UR SCREEN: NOT DETECTED
BENZODIAZEPINE, UR SCRN: NOT DETECTED
CANNABINOID 50 NG, UR ~~LOC~~: POSITIVE — AB
COCAINE METABOLITE, UR ~~LOC~~: NOT DETECTED
MDMA (Ecstasy)Ur Screen: NOT DETECTED
METHADONE SCREEN, URINE: NOT DETECTED
Opiate, Ur Screen: POSITIVE — AB
Phencyclidine (PCP) Ur S: NOT DETECTED
TRICYCLIC, UR SCREEN: POSITIVE — AB

## 2016-04-17 LAB — PREGNANCY, URINE: PREG TEST UR: NEGATIVE

## 2016-04-17 LAB — POCT PREGNANCY, URINE: Preg Test, Ur: NEGATIVE

## 2016-04-17 SURGERY — HYSTEROSCOPY
Anesthesia: General

## 2016-04-17 MED ORDER — FENTANYL CITRATE (PF) 100 MCG/2ML IJ SOLN
INTRAMUSCULAR | Status: DC | PRN
  Administered 2016-04-17 (×2): 25 ug via INTRAVENOUS
  Administered 2016-04-17: 50 ug via INTRAVENOUS

## 2016-04-17 MED ORDER — FLUCONAZOLE 150 MG PO TABS: 150.0000 mg | ORAL_TABLET | Freq: Once | ORAL | Status: DC

## 2016-04-17 MED ORDER — OXYCODONE HCL 5 MG PO TABS
ORAL_TABLET | ORAL | Status: AC
  Filled 2016-04-17: qty 1

## 2016-04-17 MED ORDER — FAMOTIDINE 20 MG PO TABS
ORAL_TABLET | ORAL | Status: AC
  Administered 2016-04-17: 20 mg via ORAL
  Filled 2016-04-17: qty 1

## 2016-04-17 MED ORDER — FENTANYL CITRATE (PF) 100 MCG/2ML IJ SOLN
25.0000 ug | INTRAMUSCULAR | Status: DC | PRN
  Administered 2016-04-17 (×2): 50 ug via INTRAVENOUS

## 2016-04-17 MED ORDER — ONDANSETRON HCL 4 MG/2ML IJ SOLN
INTRAMUSCULAR | Status: DC | PRN
  Administered 2016-04-17: 4 mg via INTRAVENOUS

## 2016-04-17 MED ORDER — METRONIDAZOLE 500 MG PO TABS: 500.0000 mg | ORAL_TABLET | Freq: Two times a day (BID) | ORAL | Status: AC

## 2016-04-17 MED ORDER — MIDAZOLAM HCL 5 MG/5ML IJ SOLN
INTRAMUSCULAR | Status: DC | PRN
  Administered 2016-04-17: 2 mg via INTRAVENOUS

## 2016-04-17 MED ORDER — FAMOTIDINE 20 MG PO TABS
20.0000 mg | ORAL_TABLET | Freq: Once | ORAL | Status: AC
  Administered 2016-04-17: 20 mg via ORAL

## 2016-04-17 MED ORDER — OXYCODONE HCL 5 MG PO TABS
5.0000 mg | ORAL_TABLET | Freq: Once | ORAL | Status: AC | PRN
  Administered 2016-04-17: 5 mg via ORAL

## 2016-04-17 MED ORDER — OXYCODONE HCL 5 MG/5ML PO SOLN: 5.0000 mg | Freq: Once | ORAL | Status: AC | PRN

## 2016-04-17 MED ORDER — PROPOFOL 10 MG/ML IV BOLUS
INTRAVENOUS | Status: DC | PRN
  Administered 2016-04-17: 200 mg via INTRAVENOUS

## 2016-04-17 MED ORDER — MEPERIDINE HCL 25 MG/ML IJ SOLN: 6.2500 mg | INTRAMUSCULAR | Status: DC | PRN

## 2016-04-17 MED ORDER — LACTATED RINGERS IV SOLN
INTRAVENOUS | Status: DC
  Administered 2016-04-17: 10:00:00 via INTRAVENOUS

## 2016-04-17 MED ORDER — HYDROCODONE-ACETAMINOPHEN 5-325 MG PO TABS: 1.0000 | ORAL_TABLET | Freq: Four times a day (QID) | ORAL | Status: DC | PRN

## 2016-04-17 MED ORDER — LACTATED RINGERS IV SOLN: INTRAVENOUS | Status: DC

## 2016-04-17 MED ORDER — PROMETHAZINE HCL 25 MG/ML IJ SOLN: 6.2500 mg | INTRAMUSCULAR | Status: DC | PRN

## 2016-04-17 MED ORDER — FENTANYL CITRATE (PF) 100 MCG/2ML IJ SOLN
INTRAMUSCULAR | Status: AC
  Filled 2016-04-17: qty 2

## 2016-04-17 MED ORDER — LIDOCAINE HCL (CARDIAC) 20 MG/ML IV SOLN
INTRAVENOUS | Status: DC | PRN
  Administered 2016-04-17: 60 mg via INTRAVENOUS

## 2016-04-17 SURGICAL SUPPLY — 20 items
CANISTER SUCT 3000ML (MISCELLANEOUS) ×3 IMPLANT
CATH ROBINSON RED A/P 16FR (CATHETERS) ×3 IMPLANT
ELECT REM PT RETURN 9FT ADLT (ELECTROSURGICAL) ×3
ELECT RESECT POWERBALL 24F (MISCELLANEOUS) ×3 IMPLANT
ELECTRODE REM PT RTRN 9FT ADLT (ELECTROSURGICAL) ×1 IMPLANT
GLOVE BIO SURGEON STRL SZ7 (GLOVE) ×6 IMPLANT
GLOVE BIO SURGEON STRL SZ7.5 (GLOVE) ×3 IMPLANT
GLOVE BIOGEL PI IND STRL 7.5 (GLOVE) ×2 IMPLANT
GLOVE BIOGEL PI INDICATOR 7.5 (GLOVE) ×4
GOWN STRL REUS W/ TWL LRG LVL3 (GOWN DISPOSABLE) ×2 IMPLANT
GOWN STRL REUS W/TWL LRG LVL3 (GOWN DISPOSABLE) ×4
IV LACTATED RINGERS 1000ML (IV SOLUTION) ×3 IMPLANT
KIT RM TURNOVER CYSTO AR (KITS) ×3 IMPLANT
LOOP CUT RT ANGL 28F (MISCELLANEOUS) ×3 IMPLANT
PACK DNC HYST (MISCELLANEOUS) ×3 IMPLANT
PAD OB MATERNITY 4.3X12.25 (PERSONAL CARE ITEMS) ×3 IMPLANT
PAD PREP 24X41 OB/GYN DISP (PERSONAL CARE ITEMS) ×3 IMPLANT
TOWEL OR 17X26 4PK STRL BLUE (TOWEL DISPOSABLE) ×3 IMPLANT
TUBING CONNECTING 10 (TUBING) ×2 IMPLANT
TUBING CONNECTING 10' (TUBING) ×1

## 2016-04-17 NOTE — Op Note (Signed)
  Operative Note    Pre-Op Diagnosis: malpositioned intrauterine device (IUD)  Post-Op Diagnosis: malpositioned intrauterine device (IUD)  Procedures:  1. hysteroscopy 2. Removal of intrauterine device (IUD)  Primary Surgeon: Thomasene MohairStephen Carole Doner, MD   EBL: minimal   IVF: 500 mL   Specimens: IUD (not sent to pathology)  Drains: None  Complications: None   Disposition: PACU   Condition: Stable   Findings: IUD in lower uterine segment with no visible extrauterine strings  Procedure Summary:   The patient was taken to the operating room where general anesthesia was administered and found to be adequate. She was placed in the dorsal supine lithotomy position in [place] stirrups and prepped and draped in usual sterile fashion. After a timeout was called an indwelling catheter was placed in her bladder. A sterile speculum was placed in the vagina.  Attempt at blind removal of the IUD in the cervix was undertaken using [place] forceps.  No IUD could be removed this way.  The cervix was dilated serially using Hegar dilators to 8mm without issue.  The hysteroscope was introduced gently through the cervical os into the lower uterine segment.  The IUD was visualized in the lower uterine segment.  Hysteroscopic graspers were introduced through the operative port and used to grasp the string of the IUD.  The IUD along with the hysteroscope were removed from the uterus and cervix without resistance.    The single-tooth tenaculum was removed from the cervix and hemostasis was verified.  After verifying no other instruments or sponges remained in the vagina, the speculum was removed.   The patient tolerated the procedure well.  Sponge, lap, needle, and instrument counts were correct x 2.  VTE prophylaxis: SCDs. Antibiotic prophylaxis: none indicated nor given. She was awakened in the operating room and was taken to the PACU in stable condition.   Thomasene MohairStephen Danissa Rundle, MD 04/17/2016 11:57 AM

## 2016-04-17 NOTE — H&P (Signed)
History and Physical Interval Note:  Claire Taylor  has presented today for surgery, with the diagnosis of RETAINED INTRAUTERINE DEVICE   The various methods of treatment have been discussed with the patient and family. After consideration of risks, benefits and other options for treatment, the patient has consented to  Procedure(s): HYSTEROSCOPY (N/A) INTRAUTERINE DEVICE (IUD) REMOVAL (N/A) as a surgical intervention .  The patient's history has been reviewed, patient examined, no change in status, stable for surgery.  I have reviewed the patient's chart and labs.  Questions were answered to the patient's satisfaction.    The patient does not take a beta blocker and one is not indicated for this surgery.   Conard NovakJackson, Alizia Greif D, MD 04/17/2016 11:17 AM

## 2016-04-17 NOTE — Anesthesia Procedure Notes (Signed)
Procedure Name: LMA Insertion Date/Time: 04/17/2016 11:31 AM Performed by: Lily Taylor, Claire Lahaie Pre-anesthesia Checklist: Patient identified, Patient being monitored, Timeout performed, Emergency Drugs available and Suction available Patient Re-evaluated:Patient Re-evaluated prior to inductionOxygen Delivery Method: Circle system utilized Preoxygenation: Pre-oxygenation with 100% oxygen Intubation Type: IV induction Ventilation: Mask ventilation without difficulty LMA: LMA inserted LMA Size: 4.0 Tube type: Oral Number of attempts: 1 Placement Confirmation: positive ETCO2 and breath sounds checked- equal and bilateral Tube secured with: Tape Dental Injury: Teeth and Oropharynx as per pre-operative assessment

## 2016-04-17 NOTE — Anesthesia Postprocedure Evaluation (Signed)
Anesthesia Post Note  Patient: Claire Taylor  Procedure(s) Performed: Procedure(s) (LRB): HYSTEROSCOPY (N/A) INTRAUTERINE DEVICE (IUD) REMOVAL (N/A)  Patient location during evaluation: PACU Anesthesia Type: General Level of consciousness: awake and alert and oriented Pain management: pain level controlled Vital Signs Assessment: post-procedure vital signs reviewed and stable Respiratory status: spontaneous breathing, nonlabored ventilation and respiratory function stable Cardiovascular status: blood pressure returned to baseline and stable Postop Assessment: no signs of nausea or vomiting Anesthetic complications: no    Last Vitals:  Filed Vitals:   04/17/16 1230 04/17/16 1242  BP: 146/93 138/82  Pulse: 79 69  Temp:    Resp: 14 17    Last Pain:  Filed Vitals:   04/17/16 1252  PainSc: Asleep                 Kamden Reber

## 2016-04-17 NOTE — Discharge Instructions (Signed)

## 2016-04-17 NOTE — Transfer of Care (Signed)
Immediate Anesthesia Transfer of Care Note  Patient: Claire Taylor  Procedure(s) Performed: Procedure(s): HYSTEROSCOPY (N/A) INTRAUTERINE DEVICE (IUD) REMOVAL (N/A)  Patient Location: PACU  Anesthesia Type:General  Level of Consciousness: awake and patient cooperative  Airway & Oxygen Therapy: Patient Spontanous Breathing and Patient connected to face mask oxygen  Post-op Assessment: Report given to RN  Post vital signs: Reviewed and stable  Last Vitals:  Filed Vitals:   04/17/16 1009 04/17/16 1211  BP: 124/83 116/74  Pulse: 78 76  Temp: 36.5 C 36.2 C  Resp: 16 8    Last Pain:  Filed Vitals:   04/17/16 1214  PainSc: 10-Worst pain ever         Complications: No apparent anesthesia complications

## 2016-04-17 NOTE — Anesthesia Preprocedure Evaluation (Addendum)
Anesthesia Evaluation  Patient identified by MRN, date of birth, ID band Patient awake    Reviewed: Allergy & Precautions, NPO status , Patient's Chart, lab work & pertinent test results  History of Anesthesia Complications Negative for: history of anesthetic complications  Airway Mallampati: II  TM Distance: >3 FB Neck ROM: Full    Dental no notable dental hx.    Pulmonary asthma (only uses albuterol inhaler a couple times per year, generally during allergy season) , Current Smoker,    breath sounds clear to auscultation- rhonchi (-) wheezing      Cardiovascular Exercise Tolerance: Good (-) hypertension(-) CAD and (-) Past MI  Rhythm:Regular Rate:Normal - Systolic murmurs and - Diastolic murmurs    Neuro/Psych PSYCHIATRIC DISORDERS Anxiety Depression Bipolar Disorder    GI/Hepatic Neg liver ROS, GERD  Controlled,  Endo/Other  negative endocrine ROSneg diabetes  Renal/GU negative Renal ROS     Musculoskeletal negative musculoskeletal ROS (+)   Abdominal (+) + obese,   Peds  Hematology  (+) anemia ,   Anesthesia Other Findings Past Medical History:   Depression                                                   Anxiety                                                      H/O umbilical hernia repair                                  GERD (gastroesophageal reflux disease)                         Comment:no meds   Plantar fasciitis                                            Anemia                                                       Asthma                                                         Comment:WELL CONTROLLED   Reproductive/Obstetrics                            Anesthesia Physical Anesthesia Plan  ASA: II  Anesthesia Plan: General   Post-op Pain Management:    Induction: Intravenous  Airway Management Planned: LMA  Additional Equipment:   Intra-op Plan:    Post-operative Plan:   Informed Consent: I have reviewed the patients History and Physical,  chart, labs and discussed the procedure including the risks, benefits and alternatives for the proposed anesthesia with the patient or authorized representative who has indicated his/her understanding and acceptance.   Dental advisory given  Plan Discussed with:   Anesthesia Plan Comments:         Anesthesia Quick Evaluation

## 2017-02-08 ENCOUNTER — Emergency Department
Admission: EM | Admit: 2017-02-08 | Discharge: 2017-02-08 | Disposition: A | Discharge status: Home / Self Care | Payer: Medicaid Other | Attending: Emergency Medicine | Admitting: Emergency Medicine

## 2017-02-08 ENCOUNTER — Encounter: Payer: Self-pay | Admitting: *Deleted

## 2017-02-08 DIAGNOSIS — Z79899 Other long term (current) drug therapy: Secondary | ICD-10-CM | POA: Diagnosis not present

## 2017-02-08 DIAGNOSIS — J45909 Unspecified asthma, uncomplicated: Secondary | ICD-10-CM | POA: Diagnosis not present

## 2017-02-08 DIAGNOSIS — Z791 Long term (current) use of non-steroidal anti-inflammatories (NSAID): Secondary | ICD-10-CM | POA: Insufficient documentation

## 2017-02-08 DIAGNOSIS — F17210 Nicotine dependence, cigarettes, uncomplicated: Secondary | ICD-10-CM | POA: Diagnosis not present

## 2017-02-08 DIAGNOSIS — F1210 Cannabis abuse, uncomplicated: Secondary | ICD-10-CM | POA: Diagnosis not present

## 2017-02-08 DIAGNOSIS — H60312 Diffuse otitis externa, left ear: Secondary | ICD-10-CM | POA: Diagnosis not present

## 2017-02-08 DIAGNOSIS — H9202 Otalgia, left ear: Secondary | ICD-10-CM | POA: Diagnosis present

## 2017-02-08 MED ORDER — CIPROFLOXACIN-DEXAMETHASONE 0.3-0.1 % OT SUSP: 4.0000 [drp] | Freq: Two times a day (BID) | OTIC | 0 refills | Status: AC

## 2017-02-08 MED ORDER — CIPROFLOXACIN-DEXAMETHASONE 0.3-0.1 % OT SUSP
4.0000 [drp] | Freq: Once | OTIC | Status: AC
  Administered 2017-02-08: 4 [drp] via OTIC
  Filled 2017-02-08: qty 7.5

## 2017-02-08 NOTE — ED Provider Notes (Signed)
[facility] Emergency Department Provider Note  ____________________________________________  Time seen: Approximately 4:08 PM  I have reviewed the triage vital signs and the nursing notes.   HISTORY  Chief Complaint Otalgia    HPI Claire Taylor is a 26 y.o. female presenting to the emergency department with acute 7/10 left otalgia for the past 3 days. Patient states that she was cleaning her left ear in the shower with a washcloth when she felt pain. Patient states that she has noticed left ear discharge on her pillow. She denies fever or chills. She denies a history of recurrent otitis media. Patient has had a similar episode of otalgia several years ago. Patient denies submerging her head in the bathtub or recent episodes of swimming. Patient denies associated nausea, vomiting and abdominal pain. No alleviating measures of an undertaken.   Past Medical History:  Diagnosis Date  . Anemia   . Anxiety   . Asthma    WELL CONTROLLED  . Depression   . GERD (gastroesophageal reflux disease)    no meds  . H/O umbilical hernia repair   . Plantar fasciitis     Patient Active Problem List   Diagnosis Date Noted  . Malpositioned IUD 04/17/2016  . Bipolar I disorder, most recent episode (or current) depressed, unspecified 02/13/2014  . Cannabis abuse, daily use 02/09/2014  . Other and unspecified ovarian cyst 03/12/2013  . Abdominal pain, other specified site 03/12/2013    Past Surgical History:  Procedure Laterality Date  . ADENOIDECTOMY    . HERNIA REPAIR    . HYSTEROSCOPY N/A 04/17/2016   Procedure: HYSTEROSCOPY;  Surgeon: Conard NovakStephen D Jackson, MD;  Location: [facility] ORS;  Service: Gynecology;  Laterality: N/A;  . IUD REMOVAL N/A 04/17/2016   Procedure: INTRAUTERINE DEVICE (IUD) REMOVAL;  Surgeon: Conard NovakStephen D Jackson, MD;  Location: [facility] ORS;  Service: Gynecology;  Laterality: N/A;  . MOUTH SURGERY     2 months ago (may 2017)  . TYMPANOSTOMY TUBE PLACEMENT     . WISDOM TOOTH EXTRACTION      Prior to Admission medications   Medication Sig Start Date End Date Taking? Authorizing Provider  acetaminophen (TYLENOL) 500 MG tablet Take 1,000 mg by mouth daily as needed for mild pain.    [provider]  albuterol (PROAIR HFA) 108 (90 Base) MCG/ACT inhaler Inhale 2 puffs into the lungs every 6 (six) hours as needed for wheezing or shortness of breath.  12/27/15 12/26/16  [provider]  ciprofloxacin-dexamethasone (CIPRODEX) otic suspension Place 4 drops into the left ear 2 (two) times daily. 02/08/17 02/15/17  Orvil FeilWoods, Jozlynn Plaia M, PA-C  clonazePAM (KLONOPIN) 0.5 MG tablet Take 0.5 mg by mouth 2 (two) times daily.  03/20/16   [provider]  divalproex (DEPAKOTE) 500 MG DR tablet Take 500 mg by mouth 2 (two) times daily.  03/20/16   [provider]  fluconazole (DIFLUCAN) 150 MG tablet Take 1 tablet (150 mg total) by mouth once. 04/17/16   Conard NovakJackson, Stephen D, MD  FLUoxetine (PROZAC) 40 MG capsule Take 40 mg by mouth every morning.  03/20/16   [provider]  gabapentin (NEURONTIN) 300 MG capsule Take 300 mg by mouth every morning. AND PRN 03/20/16   [provider]  HYDROcodone-acetaminophen (NORCO) 5-325 MG tablet Take 1 tablet by mouth every 6 (six) hours as needed for moderate pain. 04/17/16   Conard NovakJackson, Stephen D, MD  ibuprofen (ADVIL,MOTRIN) 200 MG tablet Take 600 mg by mouth daily as needed for moderate pain.  [provider]  QUEtiapine (SEROQUEL) 50 MG tablet Take 50 mg by mouth 3 (three) times daily. 12/27/15   [provider]  tiZANidine (ZANAFLEX) 4 MG tablet Take 2-4 mg by mouth 2 (two) times daily as needed for muscle spasms.  03/20/16   [provider]    Allergies Banana; Lactose intolerance (gi); Mylanta [alum & mag hydroxide-simeth]; and Relpax [eletriptan]  History reviewed. No pertinent family history.  Social History Social History  Substance Use Topics  . Smoking  status: Heavy Tobacco Smoker    Packs/day: 0.50    Years: 10.00    Types: Cigarettes  . Smokeless tobacco: Not on file  . Alcohol use Yes     Comment: 2 shots daily     Review of Systems  Constitutional: No fever/chills Eyes: No visual changes. No discharge ENT: Patient has left otalgia. Cardiovascular: no chest pain. Respiratory: no cough. No SOB. Gastrointestinal: No abdominal pain.  No nausea, no vomiting.  No diarrhea.  No constipation. Musculoskeletal: Negative for musculoskeletal pain. Skin: Negative for rash, abrasions, lacerations, ecchymosis. Neurological: Negative for headaches, focal weakness or numbness.   ____________________________________________   PHYSICAL EXAM:  VITAL SIGNS: ED Triage Vitals  Enc Vitals Group     BP 02/08/17 1538 (!) 146/91     Pulse Rate 02/08/17 1538 (!) 110     Resp 02/08/17 1538 16     Temp 02/08/17 1538 98.6 F (37 C)     Temp Source 02/08/17 1538 Oral     SpO2 02/08/17 1538 99 %     Weight 02/08/17 1536 220 lb (99.8 kg)     Height 02/08/17 1536 5\' 5"  (1.651 m)     Head Circumference --      Peak Flow --      Pain Score 02/08/17 1535 7     Pain Loc --      Pain Edu? --      Excl. in GC? --      Constitutional: Alert and oriented. Well appearing and in no acute distress. Eyes: Conjunctivae are normal. PERRL. EOMI. Head: Atraumatic. ENT:      Ears: Patient has reproducible left ear pain with palpation of the tragus. Left external auditory canal is erythematous with green purulent exudate visualized. Left tympanic membrane is pearly. Right tympanic membrane is pearly. Right external auditory canal is non-erythematous.      Nose: No congestion/rhinnorhea.      Mouth/Throat: Mucous membranes are moist.  Hematological/Lymphatic/Immunilogical: No cervical lymphadenopathy.  Cardiovascular: Normal rate, regular rhythm. Normal S1 and S2.  Good peripheral circulation. Respiratory: Normal respiratory effort without tachypnea or  retractions. Lungs CTAB. Good air entry to the bases with no decreased or absent breath sounds. Musculoskeletal: Full range of motion to all extremities. No gross deformities appreciated. Neurologic:  Normal speech and language. No gross focal neurologic deficits are appreciated.  Skin: No projection of the left pinna Psychiatric: Mood and affect are normal. Speech and behavior are normal. Patient exhibits appropriate insight and judgement.   ____________________________________________   LABS (all labs ordered are listed, but only abnormal results are displayed)  Labs Reviewed - No data to display ____________________________________________  EKG   ____________________________________________  RADIOLOGY  No results found.  ____________________________________________    PROCEDURES  Procedure(s) performed:    Procedures    Medications  ciprofloxacin-dexamethasone (CIPRODEX) 0.3-0.1 % otic suspension 4 drop (not administered)     ____________________________________________   INITIAL IMPRESSION / ASSESSMENT AND PLAN / ED COURSE  Pertinent labs &  imaging results that were available during my care of the patient were reviewed by me and considered in my medical decision making (see chart for details).  Review of the [place] CSRS was performed in accordance of the [facility] prior to dispensing any controlled drugs.     Assessment and plan: Otitis externa Patient presents to the emergency department with left otalgia reproduced with palpation of the tragus. On physical exam, patient had an erythematous left external auditory canal with green purulent exudate. Patient was given Ciprodex in the emergency department and discharged with Ciprodex. Patient was advised to follow-up with her primary care provider in one week. Patient voiced understanding regarding this recommendation. Vital signs were reassuring at discharge aside from mild tachycardia. All patient questions were  answered.    ____________________________________________  FINAL CLINICAL IMPRESSION(S) / ED DIAGNOSES  Final diagnoses:  Acute diffuse otitis externa of left ear      NEW MEDICATIONS STARTED DURING THIS VISIT:  New Prescriptions   CIPROFLOXACIN-DEXAMETHASONE (CIPRODEX) OTIC SUSPENSION    Place 4 drops into the left ear 2 (two) times daily.        This chart was dictated using voice recognition software/Dragon. Despite best efforts to proofread, errors can occur which can change the meaning. Any change was purely unintentional.    Orvil Feil, PA-C 02/08/17 1617    Pershing Proud Myra Rude, MD 02/08/17 [phone]

## 2017-02-08 NOTE — ED Triage Notes (Signed)
States she was cleaning her left ear in the shower and feels as if her ear is full of water

## 2017-02-08 NOTE — ED Notes (Signed)
See triage note.states she developed discomfort to left ear about 3 days ago. No fever but it has been draining slightly

## 2017-02-11 ENCOUNTER — Telehealth: Payer: Self-pay | Admitting: Emergency Medicine

## 2017-02-11 NOTE — Telephone Encounter (Signed)
Patient called as she cannot afford the ciprodex ear drops.  Per dr Alphonzo Lemmingsmcshane can change to cipro eye drops 0.3%.  4 drops twice daily for 7-10 days.  Called med to [facility] [place] and explained the med to patient.

## 2017-04-17 ENCOUNTER — Encounter: Payer: Self-pay | Admitting: Emergency Medicine

## 2017-04-17 ENCOUNTER — Emergency Department
Admission: EM | Admit: 2017-04-17 | Discharge: 2017-04-17 | Disposition: A | Discharge status: Home / Self Care | Payer: Medicaid Other | Attending: Emergency Medicine | Admitting: Emergency Medicine

## 2017-04-17 DIAGNOSIS — W230XXA Caught, crushed, jammed, or pinched between moving objects, initial encounter: Secondary | ICD-10-CM | POA: Insufficient documentation

## 2017-04-17 DIAGNOSIS — S91112A Laceration without foreign body of left great toe without damage to nail, initial encounter: Secondary | ICD-10-CM | POA: Insufficient documentation

## 2017-04-17 DIAGNOSIS — F17210 Nicotine dependence, cigarettes, uncomplicated: Secondary | ICD-10-CM | POA: Diagnosis not present

## 2017-04-17 DIAGNOSIS — J45909 Unspecified asthma, uncomplicated: Secondary | ICD-10-CM | POA: Insufficient documentation

## 2017-04-17 DIAGNOSIS — Y9301 Activity, walking, marching and hiking: Secondary | ICD-10-CM | POA: Diagnosis not present

## 2017-04-17 DIAGNOSIS — Z79899 Other long term (current) drug therapy: Secondary | ICD-10-CM | POA: Diagnosis not present

## 2017-04-17 DIAGNOSIS — Y92524 Gas station as the place of occurrence of the external cause: Secondary | ICD-10-CM | POA: Diagnosis not present

## 2017-04-17 DIAGNOSIS — Y998 Other external cause status: Secondary | ICD-10-CM | POA: Insufficient documentation

## 2017-04-17 DIAGNOSIS — S91111A Laceration without foreign body of right great toe without damage to nail, initial encounter: Secondary | ICD-10-CM

## 2017-04-17 MED ORDER — TETANUS-DIPHTH-ACELL PERTUSSIS 5-2.5-18.5 LF-MCG/0.5 IM SUSP
0.5000 mL | Freq: Once | INTRAMUSCULAR | Status: AC
  Administered 2017-04-17: 0.5 mL via INTRAMUSCULAR
  Filled 2017-04-17: qty 0.5

## 2017-04-17 MED ORDER — CEPHALEXIN 500 MG PO CAPS: 500.0000 mg | ORAL_CAPSULE | Freq: Four times a day (QID) | ORAL | 0 refills | Status: AC

## 2017-04-17 MED ORDER — FLUCONAZOLE 100 MG PO TABS: 100.0000 mg | ORAL_TABLET | Freq: Every day | ORAL | 0 refills | Status: AC

## 2017-04-17 NOTE — Discharge Instructions (Signed)
Continue to monitor the wound area for signs of infection. If you note any development of signs of infection follow-up with her primary care or return to the emergency department. The wound was closed with Dermabond glue it will wear away over time. Keep area clean and dry. If you're wearing sandals her flip-flops keep the wound covered. Take prescribed antibiotics as directed.

## 2017-04-17 NOTE — ED Triage Notes (Signed)
Pt reports she was opening a door at [employer] when her left great toe got caught underneath the bottom of the door. Pt has toe wrapped in gauze, states she is seeing pus and it is starting to get infected.  Pt states the nail is intact.

## 2017-04-17 NOTE — ED Notes (Signed)
Reviewed d/c instructions, follow-up care, prescriptions with patient. Pt requested prescription for Diflucan. PA informed. PA provided prescription. RN reviewed prescription with patient. Pt verbalized understanding

## 2017-04-17 NOTE — ED Provider Notes (Signed)
[facility] Emergency Department Provider Note   ____________________________________________   I have reviewed the triage vital signs and the nursing notes.   HISTORY  Chief Complaint Laceration    HPI Claire Taylor is a 26 y.o. female since emergency Department with left great toe pain secondary to small laceration along the medial aspect of the toe without nail involvement. Patient reports while walking [place] door [place] [employer] catching her toe on the metal part of the door. Patient denies any other injury to the foot other than the small laceration. Patient reports intact movement and sensation of the great toe since the injury. Patient is unsure of her tetanus status and feels she may have had it during another hospital admission in 2014. Patient has been ambulatory since the injury. Patient denies fever, chills, headache, vision changes, chest pain, chest tightness, shortness of breath, abdominal pain, nausea and vomiting.  Past Medical History:  Diagnosis Date  . Anemia   . Anxiety   . Asthma    WELL CONTROLLED  . Depression   . GERD (gastroesophageal reflux disease)    no meds  . H/O umbilical hernia repair   . Plantar fasciitis     Patient Active Problem List   Diagnosis Date Noted  . Malpositioned IUD 04/17/2016  . Bipolar I disorder, most recent episode (or current) depressed, unspecified 02/13/2014  . Cannabis abuse, daily use 02/09/2014  . Other and unspecified ovarian cyst 03/12/2013  . Abdominal pain, other specified site 03/12/2013    Past Surgical History:  Procedure Laterality Date  . ADENOIDECTOMY    . HERNIA REPAIR    . HYSTEROSCOPY N/A 04/17/2016   Procedure: HYSTEROSCOPY;  Surgeon: Conard NovakStephen D Jackson, MD;  Location: [facility] ORS;  Service: Gynecology;  Laterality: N/A;  . IUD REMOVAL N/A 04/17/2016   Procedure: INTRAUTERINE DEVICE (IUD) REMOVAL;  Surgeon: Conard NovakStephen D Jackson, MD;  Location: [facility] ORS;  Service:  Gynecology;  Laterality: N/A;  . MOUTH SURGERY     2 months ago (may 2017)  . TYMPANOSTOMY TUBE PLACEMENT    . WISDOM TOOTH EXTRACTION      Prior to Admission medications   Medication Sig Start Date End Date Taking? Authorizing Provider  acetaminophen (TYLENOL) 500 MG tablet Take 1,000 mg by mouth daily as needed for mild pain.    [provider]  albuterol (PROAIR HFA) 108 (90 Base) MCG/ACT inhaler Inhale 2 puffs into the lungs every 6 (six) hours as needed for wheezing or shortness of breath.  12/27/15 12/26/16  [provider]  cephALEXin (KEFLEX) 500 MG capsule Take 1 capsule (500 mg total) by mouth 4 (four) times daily. 04/17/17 04/22/17  Vaida Kerchner M, PA-C  clonazePAM (KLONOPIN) 0.5 MG tablet Take 0.5 mg by mouth 2 (two) times daily.  03/20/16   [provider]  divalproex (DEPAKOTE) 500 MG DR tablet Take 500 mg by mouth 2 (two) times daily.  03/20/16   [provider]  fluconazole (DIFLUCAN) 100 MG tablet Take 1 tablet (100 mg total) by mouth daily. 04/18/17 04/20/17  Hiral Lukasiewicz M, PA-C  FLUoxetine (PROZAC) 40 MG capsule Take 40 mg by mouth every morning.  03/20/16   [provider]  gabapentin (NEURONTIN) 300 MG capsule Take 300 mg by mouth every morning. AND PRN 03/20/16   [provider]  HYDROcodone-acetaminophen (NORCO) 5-325 MG tablet Take 1 tablet by mouth every 6 (six) hours as needed for moderate pain. 04/17/16   Conard NovakJackson, Stephen D, MD  ibuprofen (ADVIL,MOTRIN) 200 MG tablet Take 600 mg by mouth daily as needed for moderate pain.    [provider]  QUEtiapine (SEROQUEL) 50 MG tablet Take 50 mg by mouth 3 (three) times daily. 12/27/15   [provider]  tiZANidine (ZANAFLEX) 4 MG tablet Take 2-4 mg by mouth 2 (two) times daily as needed for muscle spasms.  03/20/16   [provider]    Allergies Banana; Lactose intolerance (gi); Mylanta [alum & mag hydroxide-simeth]; and Relpax [eletriptan]  History  reviewed. No pertinent family history.  Social History Social History  Substance Use Topics  . Smoking status: Current Every Day Smoker    Packs/day: 0.50    Years: 10.00    Types: Cigarettes  . Smokeless tobacco: Never Used  . Alcohol use Yes     Comment: 2 shots daily    Review of Systems Constitutional: Negative for fever/chills Eyes: No visual changes. Cardiovascular: Denies chest pain. Musculoskeletal: Left great toe pain. Skin: Negative for rash. Small laceration and abrasion to the medial aspect of the left great toe without nailbed involvement. Neurological: Negative for headaches.  Negative focal weakness or numbness. Negative for loss of consciousness. Able to ambulate. ____________________________________________   PHYSICAL EXAM:  VITAL SIGNS: ED Triage Vitals  Enc Vitals Group     BP 04/17/17 1808 128/85     Pulse Rate 04/17/17 1808 91     Resp 04/17/17 1808 18     Temp 04/17/17 1808 98.9 F (37.2 C)     Temp Source 04/17/17 1808 Oral     SpO2 04/17/17 1808 99 %     Weight 04/17/17 1809 216 lb (98 kg)     Height 04/17/17 1809 5' 5.5" (1.664 m)     Head Circumference --      Peak Flow --      Pain Score 04/17/17 1808 8     Pain Loc --      Pain Edu? --      Excl. in GC? --     Constitutional: Alert and oriented. Well appearing and in no acute distress.  Head: Normocephalic and atraumatic. Eyes: Conjunctivae are normal. PERRL. Normal extraocular movements. . Mouth/Throat: Mucous membranes are moist.  Cardiovascular: Normal rate, regular rhythm. Normal distal pulses. Respiratory: Normal respiratory effort.  Gastrointestinal: Soft and nontender. No distention. Musculoskeletal: Intact sensation, movement of the left great toe. Otherwise,  nontender with normal range of motion in all extremities. Neurologic: Normal speech and language.  Skin:  Skin is warm, dry and intact. No rash noted. Left great toe superficial laceration without nail involvement and no  visible foreign bodies.  Psychiatric: Mood and affect are normal.  ____________________________________________   LABS (all labs ordered are listed, but only abnormal results are displayed)  Labs Reviewed - No data to display ____________________________________________  EKG None ____________________________________________  RADIOLOGY None ____________________________________________   PROCEDURES  Procedure(s) performed: LACERATION REPAIR Performed by: Clois Comber Authorized by: Clois Comber Consent: Verbal consent obtained. Risks and benefits: risks, benefits and alternatives were discussed Consent given by: patient Patient identity confirmed: provided demographic data Prepped and Draped in normal sterile fashion Wound explored  Laceration Location: Left great toe, medial aspect superficial  Laceration Length: 1.5 cm  No Foreign Bodies seen or palpated  Irrigation method: Surgical soap and NS, f/b NS Amount of cleaning: standard  Skin closure: Dermabond closure  Patient tolerance: Patient tolerated the procedure well with no immediate complications.   Critical Care performed: no ____________________________________________  INITIAL IMPRESSION / ASSESSMENT AND PLAN / ED COURSE  Pertinent labs & imaging results that were available during my care of the patient were reviewed by me and considered in my medical decision making (see chart for details).  Patient sustained a laceration left great toe.  Assessment confirmed movement and sensation of the digit before and after wound closure. Laceration required Dermabond closure as noted above. Patient tolerated procedure well. Pt instructed to keep wound clean and dry. Patient received TDAP vaccine during course of care and will be prescribed course of cephalexin for antibiotic coverage. Patient instructed to watch for signs of infection and return if changes are noted. Patient informed of clinical course,  understand medical decision-making process, and agree with plan.  Patient was advised to follow up with PCP as needed and was also advised to return to the emergency department for symptoms that change or worsen.      ____________________________________________   FINAL CLINICAL IMPRESSION(S) / ED DIAGNOSES  Final diagnoses:  Laceration of right great toe without foreign body present or damage to nail, initial encounter       NEW MEDICATIONS STARTED DURING THIS VISIT:  Discharge Medication List as of 04/17/2017  7:58 PM    START taking these medications   Details  cephALEXin (KEFLEX) 500 MG capsule Take 1 capsule (500 mg total) by mouth 4 (four) times daily., Starting Fri 04/17/2017, Until Wed 04/22/2017, Print         Note:  This document was prepared using Dragon voice recognition software and may include unintentional dictation errors.   Ygnacio Fecteau, Karl Pock 04/17/17 2019    Emily Filbert, MD 04/17/17 2024

## 2017-04-17 NOTE — ED Notes (Signed)
ED Provider at bedside. 

## 2017-06-28 ENCOUNTER — Encounter: Payer: Self-pay | Admitting: Emergency Medicine

## 2017-06-28 ENCOUNTER — Emergency Department
Admission: EM | Admit: 2017-06-28 | Discharge: 2017-06-28 | Disposition: A | Discharge status: Home / Self Care | Payer: Medicaid Other | Attending: Emergency Medicine | Admitting: Emergency Medicine

## 2017-06-28 DIAGNOSIS — R222 Localized swelling, mass and lump, trunk: Secondary | ICD-10-CM | POA: Diagnosis present

## 2017-06-28 DIAGNOSIS — F17210 Nicotine dependence, cigarettes, uncomplicated: Secondary | ICD-10-CM | POA: Diagnosis not present

## 2017-06-28 DIAGNOSIS — Z79899 Other long term (current) drug therapy: Secondary | ICD-10-CM | POA: Diagnosis not present

## 2017-06-28 DIAGNOSIS — L02212 Cutaneous abscess of back [any part, except buttock]: Secondary | ICD-10-CM | POA: Diagnosis not present

## 2017-06-28 DIAGNOSIS — L0231 Cutaneous abscess of buttock: Secondary | ICD-10-CM

## 2017-06-28 DIAGNOSIS — J45909 Unspecified asthma, uncomplicated: Secondary | ICD-10-CM | POA: Diagnosis not present

## 2017-06-28 MED ORDER — HYDROCODONE-ACETAMINOPHEN 5-325 MG PO TABS: 1.0000 | ORAL_TABLET | Freq: Four times a day (QID) | ORAL | 0 refills | Status: DC | PRN

## 2017-06-28 MED ORDER — SULFAMETHOXAZOLE-TRIMETHOPRIM 800-160 MG PO TABS: 1.0000 | ORAL_TABLET | Freq: Two times a day (BID) | ORAL | 0 refills | Status: DC

## 2017-06-28 MED ORDER — FLUCONAZOLE 150 MG PO TABS: 150.0000 mg | ORAL_TABLET | Freq: Every day | ORAL | 0 refills | Status: DC

## 2017-06-28 NOTE — ED Provider Notes (Signed)
[facility] Emergency Department Provider Note  ____________________________________________   First MD Initiated Contact with Patient 06/28/17 1331     (approximate)  I have reviewed the triage vital signs and the nursing notes.   HISTORY  Chief Complaint [place]    HPI Claire Taylor is a 26 y.o. female is here with complaint of "bump 1 inch away from rectum". Patient states that she noticed this 2 days ago. It is become tender. Patient states that she has a history of acne and feels that she is getting a "acne cyst". She denies any fever or chills. There is been no drainage from the area. Patient has not taken any over-the-counter medication. Currently she rates her pain as an 8 out of 10.   Past Medical History:  Diagnosis Date  . Anemia   . Anxiety   . Asthma    WELL CONTROLLED  . Depression   . GERD (gastroesophageal reflux disease)    no meds  . H/O umbilical hernia repair   . Plantar fasciitis     Patient Active Problem List   Diagnosis Date Noted  . Malpositioned IUD 04/17/2016  . Bipolar I disorder, most recent episode (or current) depressed, unspecified 02/13/2014  . Cannabis abuse, daily use 02/09/2014  . Other and unspecified ovarian cyst 03/12/2013  . Abdominal pain, other specified site 03/12/2013    Past Surgical History:  Procedure Laterality Date  . ADENOIDECTOMY    . HERNIA REPAIR    . HYSTEROSCOPY N/A 04/17/2016   Procedure: HYSTEROSCOPY;  Surgeon: Conard Novak, MD;  Location: [facility] ORS;  Service: Gynecology;  Laterality: N/A;  . IUD REMOVAL N/A 04/17/2016   Procedure: INTRAUTERINE DEVICE (IUD) REMOVAL;  Surgeon: Conard Novak, MD;  Location: [facility] ORS;  Service: Gynecology;  Laterality: N/A;  . MOUTH SURGERY     2 months ago (may 2017)  . TYMPANOSTOMY TUBE PLACEMENT    . WISDOM TOOTH EXTRACTION      Prior to Admission medications   Medication Sig Start Date End Date Taking? Authorizing Provider  acetaminophen  (TYLENOL) 500 MG tablet Take 1,000 mg by mouth daily as needed for mild pain.    [provider]  albuterol (PROAIR HFA) 108 (90 Base) MCG/ACT inhaler Inhale 2 puffs into the lungs every 6 (six) hours as needed for wheezing or shortness of breath.  12/27/15 12/26/16  [provider]  clonazePAM (KLONOPIN) 0.5 MG tablet Take 0.5 mg by mouth 2 (two) times daily.  03/20/16   [provider]  divalproex (DEPAKOTE) 500 MG DR tablet Take 500 mg by mouth 2 (two) times daily.  03/20/16   [provider]  fluconazole (DIFLUCAN) 150 MG tablet Take 1 tablet (150 mg total) by mouth daily. 06/28/17   Tommi Rumps, PA-C  FLUoxetine (PROZAC) 40 MG capsule Take 40 mg by mouth every morning.  03/20/16   [provider]  gabapentin (NEURONTIN) 300 MG capsule Take 300 mg by mouth every morning. AND PRN 03/20/16   [provider]  HYDROcodone-acetaminophen (NORCO/VICODIN) 5-325 MG tablet Take 1 tablet by mouth every 6 (six) hours as needed for moderate pain. 06/28/17   Tommi Rumps, PA-C  ibuprofen (ADVIL,MOTRIN) 200 MG tablet Take 600 mg by mouth daily as needed for moderate pain.    [provider]  QUEtiapine (SEROQUEL) 50 MG tablet Take 50 mg by mouth 3 (three) times daily. 12/27/15   [provider]  sulfamethoxazole-trimethoprim (BACTRIM DS,SEPTRA DS) 800-160 MG tablet Take  1 tablet by mouth 2 (two) times daily. 06/28/17   Tommi Rumps, PA-C    Allergies Banana; Lactose intolerance (gi); Mylanta [alum & mag hydroxide-simeth]; Relpax [eletriptan]; and Zanaflex [tizanidine hcl]  History reviewed. No pertinent family history.  Social History Social History  Substance Use Topics  . Smoking status: Current Every Day Smoker    Packs/day: 0.50    Years: 10.00    Types: Cigarettes  . Smokeless tobacco: Never Used  . Alcohol use Yes     Comment: 2 shots daily    Review of Systems Constitutional: No fever/chills Cardiovascular:  Denies chest pain. Respiratory: Denies shortness of breath. Gastrointestinal: No abdominal pain.  No nausea, no vomiting.  No diarrhea.  No constipation. Skin: Positive for "bump". Neurological: Negative for headaches, focal weakness or numbness. ___________________________________________   PHYSICAL EXAM:  VITAL SIGNS: ED Triage Vitals  Enc Vitals Group     BP 06/28/17 1258 134/88     Pulse Rate 06/28/17 1258 (!) 112     Resp 06/28/17 1258 18     Temp 06/28/17 1258 98.3 F (36.8 C)     Temp Source 06/28/17 1258 Oral     SpO2 06/28/17 1258 99 %     Weight 06/28/17 1258 203 lb (92.1 kg)     Height 06/28/17 1258  (1.651 m)     Head Circumference --      Peak Flow --      Pain Score 06/28/17 1251 8     Pain Loc --      Pain Edu? --      Excl. in GC? --    Constitutional: Alert and oriented. Well appearing and in no acute distress. Eyes: Conjunctivae are normal.  Head: Atraumatic. Neck: No stridor.   Cardiovascular: Normal rate, regular rhythm. Grossly normal heart sounds.  Good peripheral circulation. Respiratory: Normal respiratory effort.  No retractions. Lungs CTAB. Musculoskeletal: Moves upper and lower extremities without difficulty. Normal gait was noted. Neurologic:  Normal speech and language. No gross focal neurologic deficits are appreciated. Skin:  Skin is warm, dry. Left buttocks and inner most portion there appears to be a very small papule that is moderately tender to palpation. There is no erythema or drainage from the area. Area is firm to touch. Consistent with possible early abscess. Psychiatric: Mood and affect are normal. Speech and behavior are normal.  ____________________________________________   LABS (all labs ordered are listed, but only abnormal results are displayed)  Labs Reviewed - No data to display   PROCEDURES  Procedure(s) performed: None  Procedures  Critical Care performed:  No  ____________________________________________   INITIAL IMPRESSION / ASSESSMENT AND PLAN / ED COURSE  Pertinent labs & imaging results that were available during my care of the patient were reviewed by me and considered in my medical decision making (see chart for details).  Patient was encouraged to do sitz baths frequently if possible or use warm moist compresses to the area. She is given a prescription for Bactrim DS twice a day for 10 days, Diflucan 150 mg by mouth if needed for yeast, Norco as needed for pain. Patient is to follow-up with her PCP or she was given the name of the surgeon on call if not improving in the next 2-3 days. Patient was given a note to remain out of work the remainder of today.   ___________________________________________   FINAL CLINICAL IMPRESSION(S) / ED DIAGNOSES  Final diagnoses:  Abscess of buttock, left  NEW MEDICATIONS STARTED DURING THIS VISIT:  Discharge Medication List as of 06/28/2017  1:56 PM    START taking these medications   Details  fluconazole (DIFLUCAN) 150 MG tablet Take 1 tablet (150 mg total) by mouth daily., Starting Sun 06/28/2017, Print    sulfamethoxazole-trimethoprim (BACTRIM DS,SEPTRA DS) 800-160 MG tablet Take 1 tablet by mouth 2 (two) times daily., Starting Sun 06/28/2017, Print         Note:  This document was prepared using Dragon voice recognition software and may include unintentional dictation errors.    Tommi Rumps, PA-C 06/28/17 1424    Minna Antis, MD 06/28/17 [phone]

## 2017-06-28 NOTE — ED Triage Notes (Signed)
Patient arrives to [facility] ED via POV with c/o 'bump 1" away from my rectum'

## 2017-06-28 NOTE — Discharge Instructions (Signed)
Follow-up with your primary care doctor or Dr. Tonita Cong  if any continued problems. Begin taking antibiotics today as directed twice a day for the next 10 days. Norco if needed for pain. Diflucan if needed for yeast infection. 2 warm compresses or sitz baths at least twice a day.

## 2024-05-31 ENCOUNTER — Ambulatory Visit: Payer: Self-pay

## 2024-05-31 DIAGNOSIS — N760 Acute vaginitis: Secondary | ICD-10-CM

## 2024-05-31 DIAGNOSIS — Z113 Encounter for screening for infections with a predominantly sexual mode of transmission: Secondary | ICD-10-CM

## 2024-05-31 LAB — HM HEPATITIS C SCREENING LAB: HM Hepatitis Screen: NEGATIVE

## 2024-05-31 LAB — HM HIV SCREENING LAB: HM HIV Screening: NEGATIVE

## 2024-05-31 LAB — WET PREP FOR TRICH, YEAST, CLUE
Clue Cell Exam: POSITIVE — AB
Trichomonas Exam: NEGATIVE
Yeast Exam: NEGATIVE

## 2024-05-31 MED ORDER — METRONIDAZOLE 500 MG PO TABS: 500.0000 mg | ORAL_TABLET | Freq: Two times a day (BID) | ORAL | Status: AC

## 2024-05-31 MED ORDER — FLUCONAZOLE 150 MG PO TABS: 150.0000 mg | ORAL_TABLET | Freq: Once | ORAL | 0 refills | Status: AC
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
## 2024-05-31 NOTE — Progress Notes (Addendum)
 Pt is here for an STD visit. Wet prep results reviewed with patient. The patient was dispensed metronidazole  500 MG tablet orally 2 x/day for 7 days. I provided counseling today regarding the medication, the side effects and when to call clinic. Patient given the opportunity to ask questions for any clarifications. Questions answered. Condoms declined. Wilkie Drought, RN.

## 2024-06-01 LAB — HBV ANTIGEN/ANTIBODY STATE LAB
Hep B Core Total Ab: NONREACTIVE
Hep B S Ab: REACTIVE
Hepatitis B Surface Antigen: NONREACTIVE

## 2024-06-05 LAB — GONOCOCCUS CULTURE

## 2024-06-06 ENCOUNTER — Telehealth: Payer: Self-pay | Admitting: Family Medicine

## 2024-06-07 NOTE — Telephone Encounter (Signed)
 Phone call to pt at [phone]. Pt confirmed identity. Discussed 05/31/24 Hep B results in detail, per [facility] Hep B screening and interpretation standing order.  Pt expressed understanding.

## 2024-06-10 ENCOUNTER — Telehealth: Payer: Self-pay | Admitting: Family Medicine

## 2024-06-15 ENCOUNTER — Ambulatory Visit: Payer: Self-pay

## 2024-06-20 ENCOUNTER — Ambulatory Visit: Payer: Self-pay

## 2024-06-20 DIAGNOSIS — Z113 Encounter for screening for infections with a predominantly sexual mode of transmission: Secondary | ICD-10-CM

## 2024-06-20 LAB — WET PREP FOR TRICH, YEAST, CLUE
Clue Cell Exam: NEGATIVE
Trichomonas Exam: NEGATIVE
Yeast Exam: NEGATIVE

## 2024-06-20 NOTE — Progress Notes (Signed)
 [facility] Problem [facility]  [facility]  Subjective:  Claire Taylor is a 33 y.o. being seen today for a follow up.  Chief Complaint  Patient presents with   SEXUALLY TRANSMITTED DISEASE    STI screening-seen a few weeks ago and treated for BV-still having symptoms   HPI: Claire Taylor here to clinic today for a follow up. She was here on 05/31/24 and had complaint of suprapubic and LLQ abdominal pain, vaginal irritation intermittently and was treated for bacterial vaginosis. All her other tests on 05/31/24 were negative, except for syphilis which is still in process. She does have IBS that causes her to have abdominal discomfort. LMP was 06/01/24. Had declined bimanual exam on 05/31/24 and declines again today.  She is well appearing, does not appear to be in any discomfort or distress. Requests to repeat her wet prep and self swab.  Health Maintenance Due  Topic Date Due   DTaP/Tdap/Td (1 - Tdap) Never done   Hepatitis B Vaccines 19-59 Average Risk (1 of 3 - 19+ 3-dose series) Never done   Pneumococcal Vaccine (2 of 2 - PCV) 12/26/2016   HPV VACCINES (1 - 3-dose SCDM series) Never done   Cervical Cancer Screening (HPV/Pap Cotest)  06/17/2021   Influenza Vaccine  04/29/2024   COVID-19 Vaccine (1 - 2024-25 season) Never done   Review of Systems  Constitutional:  Negative for chills and fever.  Gastrointestinal:  Positive for abdominal pain.  Genitourinary:  Negative for dysuria.  All other systems reviewed and are negative.  The following portions of the patient's history were reviewed and updated as appropriate: allergies, current medications, past family history, past medical history, past social history, past surgical history and problem list. Problem list updated.  See flowsheet for other program required questions.  Objective:  There were no vitals filed for this visit.  Physical Exam Constitutional:      Appearance: Normal appearance.  HENT:      Head: Normocephalic.     Mouth/Throat:     Mouth: Mucous membranes are moist.  Eyes:     General: No scleral icterus.       Right eye: No discharge.        Left eye: No discharge.  Pulmonary:     Effort: Pulmonary effort is normal.  Abdominal:     General: Abdomen is flat.     Palpations: Abdomen is soft.     Tenderness: There is abdominal tenderness in the suprapubic area and left lower quadrant. There is no rebound.     Comments: Mild tenderness  Skin:    General: Skin is warm and dry.  Neurological:     General: No focal deficit present.     Mental Status: She is alert.  Psychiatric:        Mood and Affect: Mood normal.        Behavior: Behavior normal.    Assessment and Plan:  Claire Taylor is a 33 y.o. female presenting to the [facility] Department for a Women's Health problem visit  1. Screening for venereal disease (Primary)  - WET PREP FOR TRICH, YEAST, CLUE negative - Advised patient to seek care at [facility], [facility], or an urgent care for UTI testing as we do not do urinalysis here at [facility] - Advised patient next step for evaluating her suprapubic/LLQ tenderness would be a pelvic US , and to seek care at a general ob/gyn practice, urgent care, or ED for this as  we do not order this type of test at the health dept.   Return if symptoms worsen or fail to improve.  No future appointments.  Damien FORBES Satchel, NP

## 2024-06-23 ENCOUNTER — Ambulatory Visit: Payer: Self-pay | Admitting: Family Medicine

## 2024-10-07 ENCOUNTER — Ambulatory Visit: Payer: Self-pay

## 2024-10-07 VITALS — BP 135/94 | HR 91 | Ht 65.5 in | Wt 232.0 lb

## 2024-10-07 DIAGNOSIS — Z113 Encounter for screening for infections with a predominantly sexual mode of transmission: Secondary | ICD-10-CM

## 2024-10-07 DIAGNOSIS — Z30011 Encounter for initial prescription of contraceptive pills: Secondary | ICD-10-CM

## 2024-10-07 DIAGNOSIS — Z124 Encounter for screening for malignant neoplasm of cervix: Secondary | ICD-10-CM

## 2024-10-07 DIAGNOSIS — Z3009 Encounter for other general counseling and advice on contraception: Secondary | ICD-10-CM

## 2024-10-07 DIAGNOSIS — G43909 Migraine, unspecified, not intractable, without status migrainosus: Secondary | ICD-10-CM | POA: Insufficient documentation

## 2024-10-07 LAB — WET PREP FOR TRICH, YEAST, CLUE
Clue Cell Exam: NEGATIVE
Trichomonas Exam: NEGATIVE
Yeast Exam: NEGATIVE

## 2024-10-07 MED ORDER — NORETHINDRONE 0.35 MG PO TABS: 1.0000 | ORAL_TABLET | Freq: Every day | ORAL | 5 refills | Status: AC

## 2024-10-07 MED ORDER — NORETHINDRONE 0.35 MG PO TABS: 1.0000 | ORAL_TABLET | Freq: Every day | ORAL | Status: DC

## 2024-10-07 MED ORDER — NORETHINDRONE 0.35 MG PO TABS: 1.0000 | ORAL_TABLET | Freq: Every day | ORAL | 11 refills | Status: DC

## 2024-10-07 MED ORDER — NORETHINDRONE 0.35 MG PO TABS: 1.0000 | ORAL_TABLET | Freq: Every day | ORAL | 5 refills | Status: DC

## 2024-10-07 NOTE — Progress Notes (Signed)
 " [facility] [address] [place] [address] Main phone: [phone]  Family Planning Visit - Initial Visit  Subjective:  Claire Taylor is a 34 y.o.  G2P1011   being seen today for an initial annual visit and to discuss reproductive life planning.  The patient is currently using condoms for pregnancy prevention. Patient does not want a pregnancy in the next year.   Patient reports they would like oral contraceptives today.  Patient has the following medical conditions: Patient Active Problem List   Diagnosis Date Noted   Migraines 10/07/2024   Asthma, well controlled 12/27/2015   Bipolar I disorder, most recent episode depressed (HCC) 02/13/2014   Cannabis abuse, daily use 02/09/2014   Other and unspecified ovarian cyst 03/12/2013   Abdominal pain, other specified site 03/12/2013   Chief Complaint  Patient presents with   Annual Exam    PE   HPI Patient reports desire for pap, STI testing.  Has irregular periods. When she is stressed, she will bleed every 2 weeks. Never skips periods. Is concerned she has PCOS due to facial hair, weight.  Also reports her dog got drunk by accidentally drinking vodka and peed on her lap and she now has irritation, discharge. Desires birth control pills - she does vape nicotine , and her BP today 135/94. She does have a hx of migraine headaches. Unsure if she has aura. No hx of DVT/PE, sickle cell. She does not smoke.  Patient denies other concerns.   Review of Systems  All other systems reviewed and are negative.  Diabetes screening This patient is 34 y.o. with a BMI of Body mass index is 38.02 kg/m.SABRA  Is patient eligible for diabetes screening (age >35 and BMI >25)?  no  Was Hgb A1c ordered? no  STI screening Patient reports 1 of partners in last year.  Does this patient desire STI screening? No  Cervical Cancer Screening   Has had pap tests, but has gone  to multiple practice over the years and unsure when last one had. Thinks it was around 5 years ago. Never had an abnormal pap.  Health Maintenance Due  Topic Date Due   DTaP/Tdap/Td (1 - Tdap) Never done   Hepatitis B Vaccines 19-59 Average Risk (1 of 3 - 19+ 3-dose series) Never done   Pneumococcal Vaccine (2 of 2 - PCV) 12/26/2016   HPV VACCINES (1 - Risk 3-dose SCDM series) Never done   Cervical Cancer Screening (HPV/Pap Cotest)  06/17/2021   Influenza Vaccine  04/29/2024   COVID-19 Vaccine (1 - 2025-26 season) Never done   The following portions of the patient's history were reviewed and updated as appropriate: allergies, current medications, past family history, past medical history, past social history, past surgical history and problem list. Problem list updated.  See flowsheet for further details and programmatic requirements Hyperlink available at the top of the signed note in blue.  Flow sheet content below:  Pregnancy Intention Screening Does the patient want to become pregnant in the next year?: No Does the patient's partner want to become pregnant in the next year?: N/A Would the patient like to discuss contraceptive options today?: Yes Sexual History What age did you start your period?: 12 How often do you have your period?: monthly Date of last sex?: 05/29/24 Has the patient had unprotected sex within the last 5 days?: No Do you have sex with men, women, both men and women?: Men only In the  past 2 months how many partners have you had sex with?: 0 In the past 12 months, how many partners have you had sex with?: 1 Is it possible that any of your sex partners in the past 12 months had sex with someone else whild they were still in a sexual relationship with you?: No What ways do you have sex?: Vaginal Do you or your partner use condoms and/or dental dams every time you have vaginal, oral or anal sex?: N/A Do you douche?: No Date of last HIV test?: 05/31/24 Have you  ever had an STD?: Yes Have any of your partners had an STD?: Yes Partner Previous STD?: Gonorrhea Date?:  (pt report when she was 80) Have you or your partner ever shot up drugs?: No Have any of your partners used drugs in the past?: No Have you or your partners exchanged money or drugs for sex?: No  Objective:   Vitals:   10/07/24 1524  BP: (!) 135/94  Pulse: 91  Weight: 232 lb (105.2 kg)  Height: 5' 5.5 (1.664 m)   Physical Exam Vitals and nursing note reviewed. Exam conducted with a chaperone present Brett Orange).  Constitutional:      Appearance: Normal appearance.  HENT:     Head: Normocephalic and atraumatic.     Comments: No nits or hair loss of scalp, brows, and lashes    Mouth/Throat:     Mouth: Mucous membranes are moist.     Pharynx: Oropharynx is clear. No oropharyngeal exudate or posterior oropharyngeal erythema.  Eyes:     General:        Right eye: No discharge.        Left eye: No discharge.     Conjunctiva/sclera: Conjunctivae normal.     Right eye: Right conjunctiva is not injected.     Left eye: Left conjunctiva is not injected.  Cardiovascular:     Heart sounds: Normal heart sounds, S1 normal and S2 normal.  Pulmonary:     Effort: Pulmonary effort is normal.     Breath sounds: Normal breath sounds.  Abdominal:     General: Abdomen is flat.     Palpations: Abdomen is soft.     Tenderness: There is no abdominal tenderness. There is no rebound.  Genitourinary:    General: Normal vulva.     Exam position: Lithotomy position.     Pubic Area: No rash or pubic lice.      Labia:        Right: No rash or lesion.        Left: No rash or lesion.      Vagina: Normal. No vaginal discharge, erythema or lesions.     Cervix: No cervical motion tenderness, discharge, lesion or erythema.     Comments: pH = <4.5 Musculoskeletal:     Right lower leg: No edema.     Left lower leg: No edema.  Lymphadenopathy:     Cervical: No cervical adenopathy.     Upper  Body:     Right upper body: No supraclavicular or axillary adenopathy.     Left upper body: No supraclavicular or axillary adenopathy.  Skin:    General: Skin is warm and dry.     Findings: No lesion or rash.  Neurological:     Mental Status: She is alert and oriented to person, place, and time.    Assessment and Plan:  Adaijah Chanel Calamia is a 34 y.o. female presenting to the [facility] Department  for an initial annual wellness/contraceptive visit  Family planning  Contraception counseling:  Reviewed options based on patient desire and reproductive life plan. Patient is interested in Oral Contraceptive. This was provided to the patient today.  Risks, benefits, and typical effectiveness rates were reviewed.  Questions were answered.  Written information was also given to the patient to review.    The patient will follow up in  1 years for surveillance.  The patient was told to call with any further questions, or with any concerns about this method of contraception.  Emphasized use of condoms 100% of the time for STI prevention.  Emergency Contraception Precautions (ECP): Patient assessed for need of ECP. She is not a candidate based on no intercourse for months.   2. Screening for venereal disease (Primary)  - No sex since last GC/CT test - Reports her dog urinated on her lap and she has had vaginal itching and discharge since - WET PREP FOR TRICH, YEAST, CLUE negative  3. Screening for cervical cancer  - IGP, Aptima HPV  4. OCP (oral contraceptive pills) initiation  - norethindrone  (MICRONOR ) 0.35 MG tablet; Take 1 tablet (0.35 mg total) by mouth daily.  Dispense: 28 tablet; Refill: 5  - 6 months given today, discussed following up to make sure she is satisfied with these pills and we can dispense another 6 months.  Return in about 3 months (around 01/05/2025).  No future appointments.  Damien FORBES Satchel, NP "

## 2024-10-07 NOTE — Progress Notes (Signed)
 Pt is here for PE. Wet prep results review with patient and requires no treatment per SO. The patient was dispensed MICRONOR   #6 packs. I provided counseling regarding the medication, the side effects and when to call clinic Opportunity given to pt to ask questions for any clarification. FP card given and condoms declined. Wilkie Drought, RN.

## 2024-10-11 LAB — IGP, APTIMA HPV
HPV Aptima: NEGATIVE
PAP Smear Comment: 0

## 2024-10-12 ENCOUNTER — Ambulatory Visit: Payer: Self-pay

## 2024-10-12 NOTE — Progress Notes (Signed)
 This patient's pap is normal/negative for intraepithelial lesion or malignancy and negative. Per patient report, no abnormal pap history.  She can follow up in 5 years per current ASCCP guidelines.  Damien Satchel [facility]
